# Patient Record
Sex: Female | Born: 1995 | Hispanic: Yes | Smoking: Never smoker
Health system: Southern US, Community
[De-identification: ages and names within clinical notes are randomized; demographics above are authoritative.]

## PROBLEM LIST (undated history)

## (undated) DIAGNOSIS — G43909 Migraine, unspecified, not intractable, without status migrainosus: Secondary | ICD-10-CM

## (undated) DIAGNOSIS — K529 Noninfective gastroenteritis and colitis, unspecified: Secondary | ICD-10-CM

## (undated) HISTORY — DX: Noninfective gastroenteritis and colitis, unspecified: K52.9

## (undated) HISTORY — DX: Migraine, unspecified, not intractable, without status migrainosus: G43.909

---
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
## 2016-12-28 IMAGING — US US OB LIMITED
1 series · 14 of 28 positions shown · non-contrast
Comparison: none

CLINICAL DATA: Pelvic pain for 3 days, pregnant, uncertain LMP and
gestational age

EXAM:
LIMITED OBSTETRIC ULTRASOUND

[Series 1: us ob limited · 0.22mm/px · 29 acquisitions, 14 frames shown]
[im 2/29]
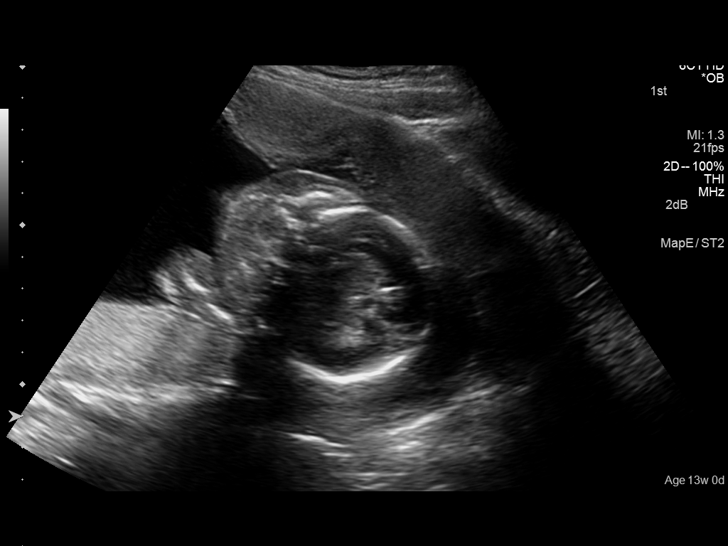
[im 4/29]
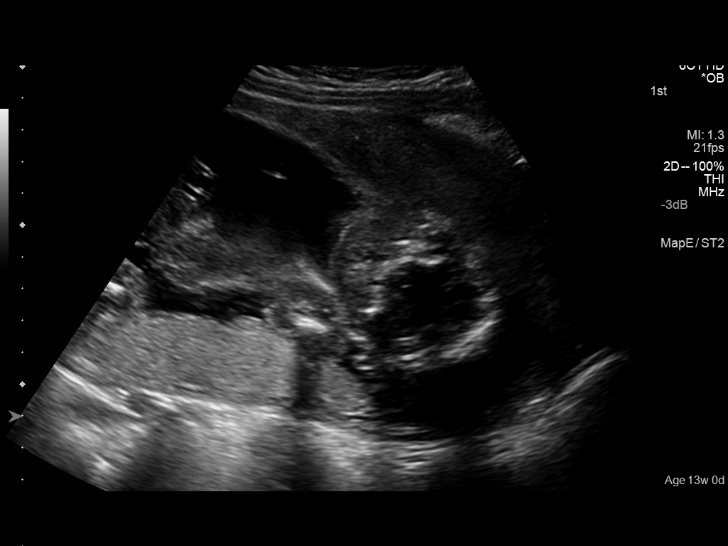
[im 6/29]
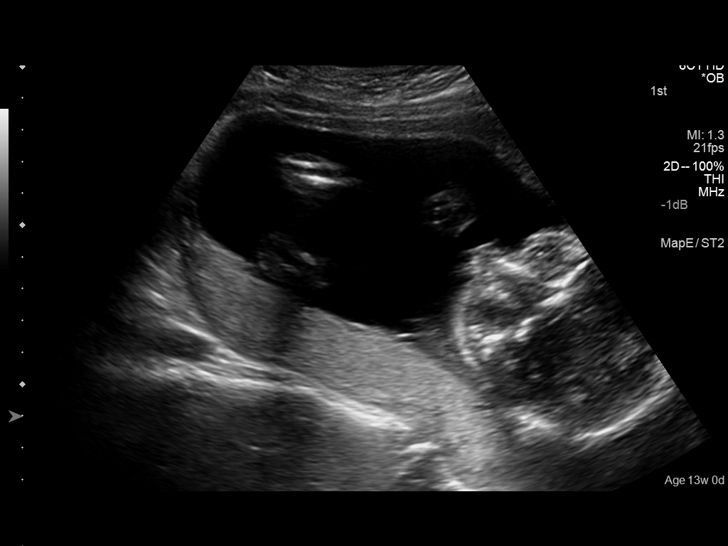
[im 8/29]
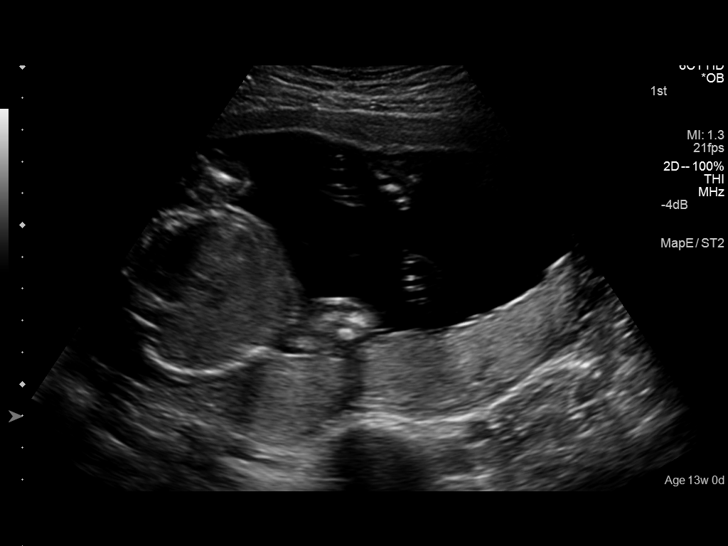
[im 10/29]
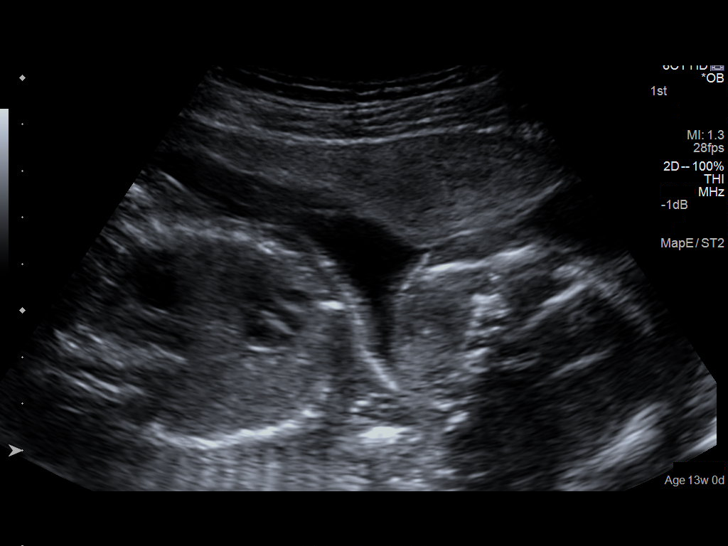
[im 12/29]
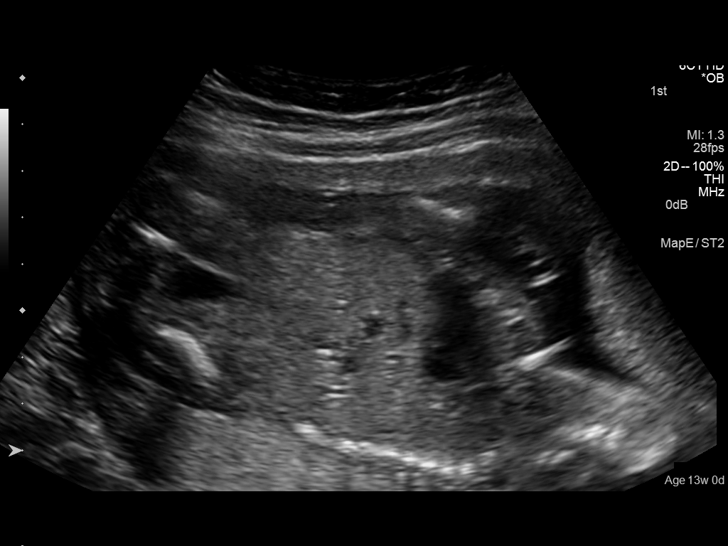
[im 14/29]
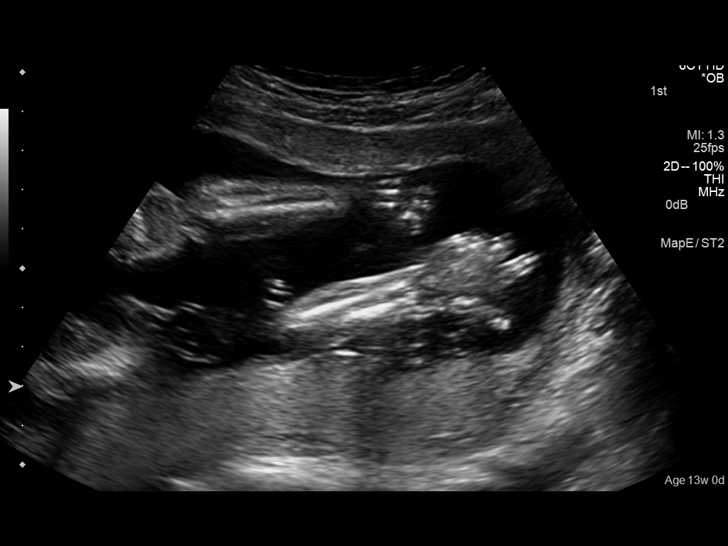
[im 16/29]
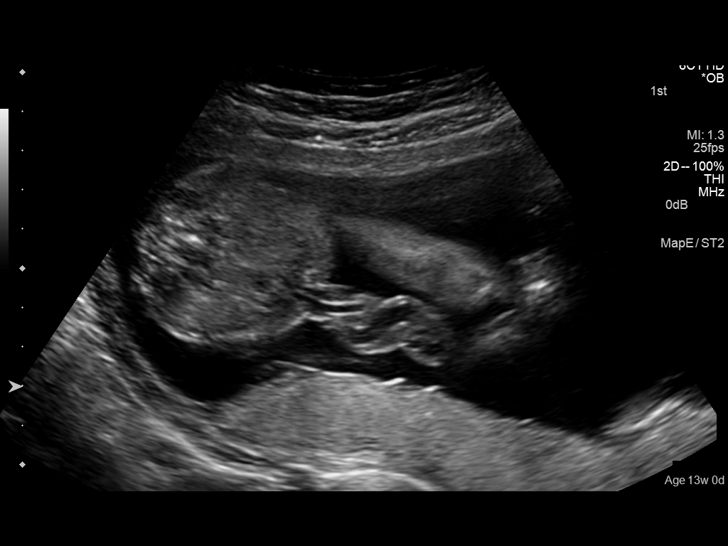
[im 18/29]
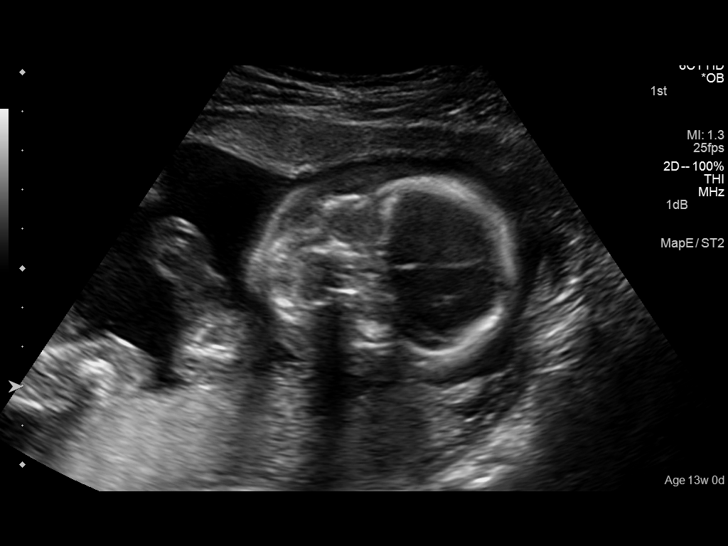
[im 20/29]
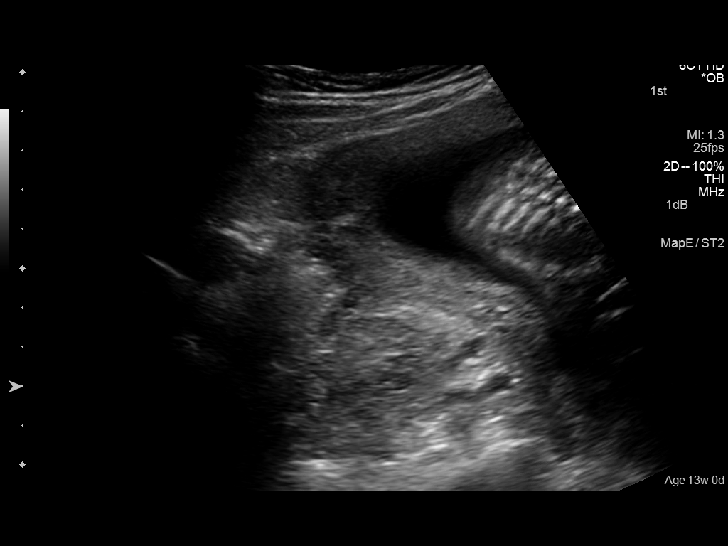
[im 22/29]
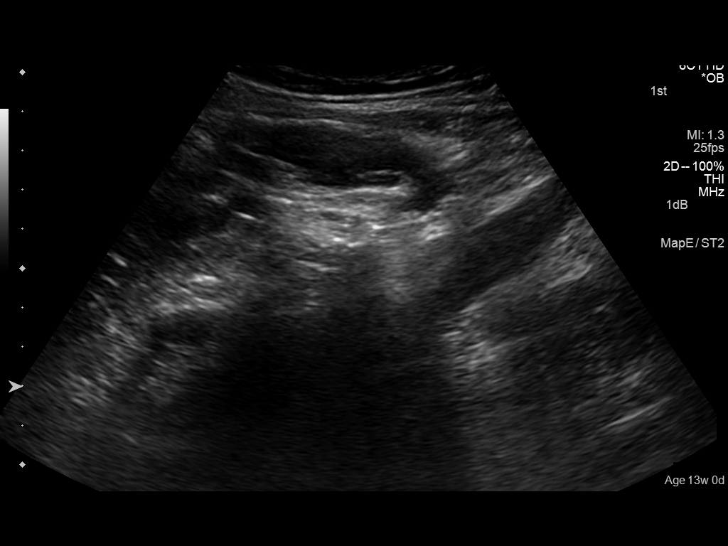
[im 24/29]
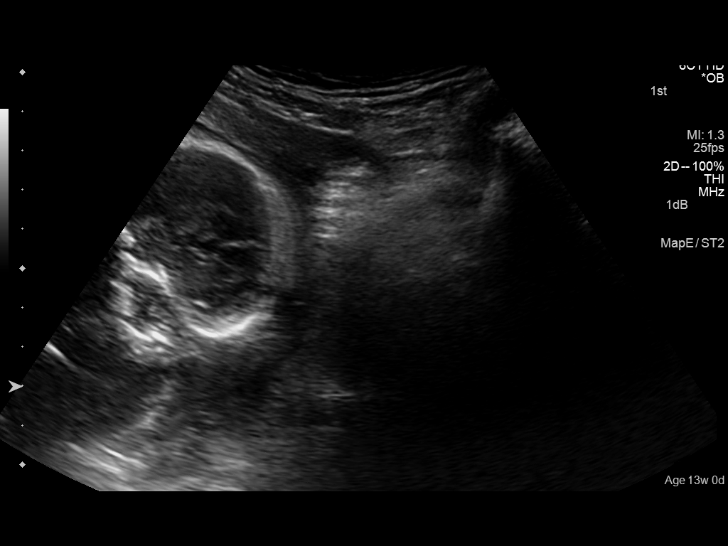
[im 26/29]
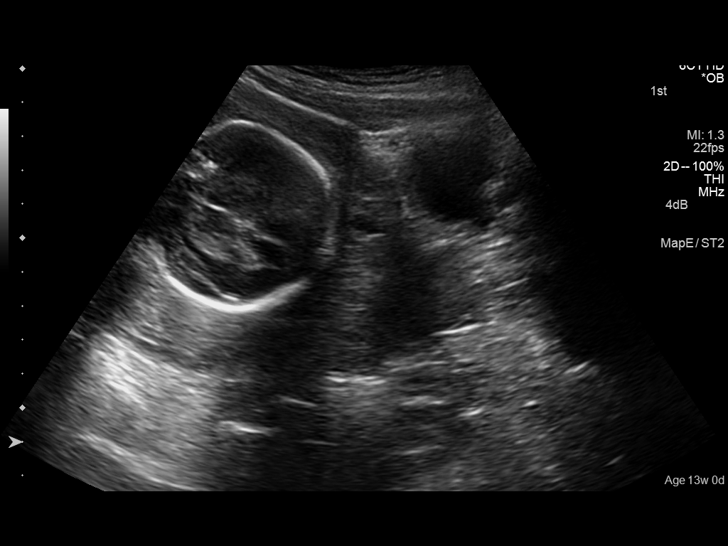
[im 29/29]
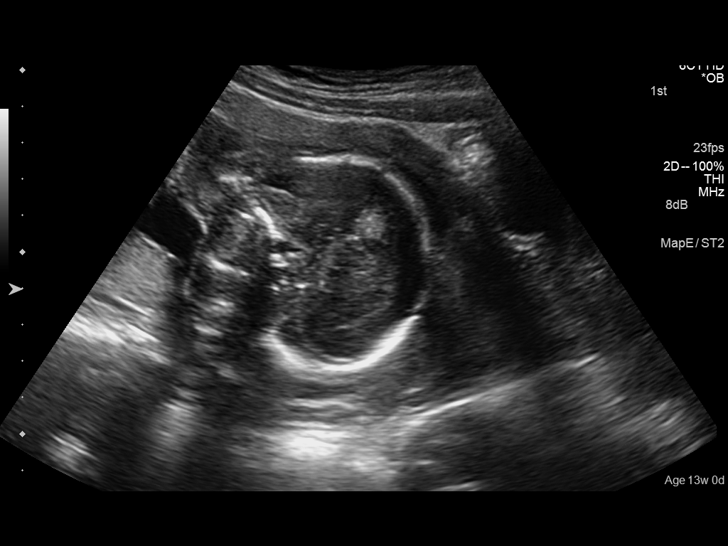

[14 of 28 positions shown; findings below may reference images not displayed]

FINDINGS: Number of Fetuses: 1

Heart Rate:  149 bpm

Movement: Yes

Presentation: Cephalic

Placental Location: Posterior

Previa: No

Amniotic Fluid (Subjective):  Within normal limits.

BPD:  5.3 cm 22 w 1 d

MATERNAL FINDINGS:

Cervix:  Appears closed.

Uterus/Adnexae:  No abnormality visualized.
IMPRESSION: Single live intrauterine gestation as above.

No acute abnormalities identified on limited OB ultrasound
assessment.

This exam is performed on an emergent basis and does not
comprehensively evaluate fetal size, dating, or anatomy; follow-up
complete OB US should be considered if further fetal assessment is
warranted.

## 2019-09-20 ENCOUNTER — Other Ambulatory Visit: Payer: Self-pay

## 2019-09-20 ENCOUNTER — Inpatient Hospital Stay (HOSPITAL_COMMUNITY)
Admission: AD | Admit: 2019-09-20 | Discharge: 2019-09-20 | Disposition: A | Discharge status: Home / Self Care | Payer: Self-pay | Attending: Obstetrics & Gynecology | Admitting: Obstetrics & Gynecology

## 2019-09-20 ENCOUNTER — Other Ambulatory Visit: Payer: Self-pay | Admitting: Obstetrics & Gynecology

## 2019-09-20 ENCOUNTER — Encounter (HOSPITAL_COMMUNITY): Payer: Self-pay | Admitting: Obstetrics & Gynecology

## 2019-09-20 ENCOUNTER — Inpatient Hospital Stay (HOSPITAL_COMMUNITY): Payer: Self-pay

## 2019-09-20 DIAGNOSIS — Z79899 Other long term (current) drug therapy: Secondary | ICD-10-CM | POA: Insufficient documentation

## 2019-09-20 DIAGNOSIS — Z3A00 Weeks of gestation of pregnancy not specified: Secondary | ICD-10-CM | POA: Insufficient documentation

## 2019-09-20 DIAGNOSIS — O020 Blighted ovum and nonhydatidiform mole: Secondary | ICD-10-CM | POA: Insufficient documentation

## 2019-09-20 DIAGNOSIS — Z6790 Unspecified blood type, Rh positive: Secondary | ICD-10-CM

## 2019-09-20 DIAGNOSIS — O4690 Antepartum hemorrhage, unspecified, unspecified trimester: Secondary | ICD-10-CM

## 2019-09-20 DIAGNOSIS — Z20822 Contact with and (suspected) exposure to covid-19: Secondary | ICD-10-CM | POA: Insufficient documentation

## 2019-09-20 LAB — CBC
HCT: 36.8 % (ref 36.0–46.0)
Hemoglobin: 12.6 g/dL (ref 12.0–15.0)
MCH: 31.2 pg (ref 26.0–34.0)
MCHC: 34.2 g/dL (ref 30.0–36.0)
MCV: 91.1 fL (ref 80.0–100.0)
Platelets: 207 10*3/uL (ref 150–400)
RBC: 4.04 MIL/uL (ref 3.87–5.11)
RDW: 12.5 % (ref 11.5–15.5)
WBC: 6.1 10*3/uL (ref 4.0–10.5)
nRBC: 0 % (ref 0.0–0.2)

## 2019-09-20 LAB — URINALYSIS, ROUTINE W REFLEX MICROSCOPIC
Bilirubin Urine: NEGATIVE
Glucose, UA: NEGATIVE mg/dL
Ketones, ur: NEGATIVE mg/dL
Nitrite: NEGATIVE
Protein, ur: NEGATIVE mg/dL
Specific Gravity, Urine: 1.011 (ref 1.005–1.030)
pH: 6 (ref 5.0–8.0)

## 2019-09-20 LAB — COMPREHENSIVE METABOLIC PANEL
ALT: 38 U/L (ref 0–44)
AST: 35 U/L (ref 15–41)
Albumin: 4 g/dL (ref 3.5–5.0)
Alkaline Phosphatase: 46 U/L (ref 38–126)
Anion gap: 8 (ref 5–15)
BUN: 5 mg/dL — ABNORMAL LOW (ref 6–20)
CO2: 25 mmol/L (ref 22–32)
Calcium: 9.7 mg/dL (ref 8.9–10.3)
Chloride: 103 mmol/L (ref 98–111)
Creatinine, Ser: 0.54 mg/dL (ref 0.44–1.00)
GFR calc Af Amer: >60 mL/min (ref >60)
GFR calc non Af Amer: >60 mL/min (ref >60)
Glucose, Bld: 84 mg/dL (ref 70–99)
Potassium: 4.4 mmol/L (ref 3.5–5.1)
Sodium: 136 mmol/L (ref 135–145)
Total Bilirubin: 0.5 mg/dL (ref 0.3–1.2)
Total Protein: 6.8 g/dL (ref 6.5–8.1)

## 2019-09-20 LAB — WET PREP, GENITAL
Clue Cells Wet Prep HPF POC: NONE SEEN
Sperm: NONE SEEN
Trich, Wet Prep: NONE SEEN
Yeast Wet Prep HPF POC: NONE SEEN

## 2019-09-20 LAB — SARS CORONAVIRUS 2 BY RT PCR (HOSPITAL ORDER, PERFORMED IN ~~LOC~~ HOSPITAL LAB): SARS Coronavirus 2: NEGATIVE

## 2019-09-20 LAB — HCG, QUANTITATIVE, PREGNANCY: hCG, Beta Chain, Quant, S: 545631 m[IU]/mL — ABNORMAL HIGH (ref <5)

## 2019-09-20 LAB — POCT PREGNANCY, URINE: Preg Test, Ur: POSITIVE — AB

## 2019-09-20 NOTE — Discharge Instructions (Signed)
Molar Pregnancy  A molar pregnancy (hydatidiform mole) is a mass of tissue that grows in the uterus after an egg is fertilized incorrectly. The mass does not develop into a fetus, and is considered an abnormal pregnancy. Usually, the pregnancy ends on its own through miscarriage. In some cases, treatment may be required. What are the causes? This condition is caused by an egg that is fertilized incorrectly so that it has abnormal genetic material (chromosomes). This can result in one of two types of molar pregnancies:  Complete molar pregnancy. This is when all of the chromosomes in the fertilized egg are from the father, and none are from the mother.  Partial molar pregnancy. This is when the fertilized egg has chromosomes from the father and mother, but it has too many chromosomes. What increases the risk? This condition is more likely to develop in:  Women who are over the age of 57 or under the age of 42.  Women who have had a molar pregnancy in the past (very rare). Other possible risk factors include:  Smoking more than 15 cigarettes a day.  History of infertility.  Having a blood type A, B, or AB.  Having a lack (deficiency) of vitamin A.  Using birth control pills (oral contraceptives). What are the signs or symptoms? Symptoms of this condition include:  Vaginal bleeding.  Missed menstrual period.  The uterus growing faster than expected for a normal pregnancy.  Severe nausea and vomiting.  Severe pressure or pain in the uterus.  Abnormal ovarian cysts (theca lutein cysts).  Vaginal discharge that looks like grapes.  High blood pressure (early onset of preeclampsia).  Overactive thyroid gland (hyperthyroidism).  Not having enough red blood cells or hemoglobin (anemia). How is this diagnosed? This condition is diagnosed based on ultrasound and blood tests. How is this treated? Usually, molar pregnancies end on their own by miscarriage. A health care provider  may manage this condition by:  Monitoring the levels of pregnancy hormones in your blood to make sure that the hormone levels are decreasing as expected.  Giving you a medicine called Rho (D) immune globulin. This medicine helps to prevent problems that may occur in future pregnancies as a result of a protein on red blood cells (Rh factor). You may be given this medicine if you do not have an Rh factor (you are Rh negative) and your sex partner has an Rh factor (he is Rh positive).  Putting you on chemotherapy. This involves taking medicines that regulate levels of pregnancy hormones. This may be done if your pregnancy hormone levels are not decreasing as expected.  Performing a procedure called dilation and curettage (D&C), or vacuum curettage. These are minor procedures that involve scraping or suctioning the molar pregnancy out of the uterus and removing it through the vagina. Even if a molar pregnancy ends on its own, one of these procedures may be done to make sure that all the abnormal tissue is out of the uterus.  Doing a surgical removal of the uterus (hysterectomy). Follow these instructions at home:  Avoid getting pregnant for 6-12 months, or as long as told by your health care provider. To avoid getting pregnant, avoid having sex or use a reliable form of birth control every time you have sex.  Take over-the-counter and prescription medicines only as told by your health care provider.  Rest as needed, and slowly return to your normal activities.  Think about joining a support group. If you are struggling with grief, ask  your health care provider for help.  Keep all follow-up visits as told by your health care provider. This is important. You may need follow-up blood tests or ultrasounds. Contact a health care provider if:  You continue to have irregular vaginal bleeding.  You have abdominal pain. Summary  A molar pregnancy (hydatidiform mole) is a mass of tissue that grows in  the uterus after an egg is fertilized incorrectly.  This condition is more likely to develop in women who are over the age of 76 or under the age of 45 or women who have had a molar pregnancy in the past.  The most common symptom of this condition is vaginal bleeding.  Usually, molar pregnancy ends with a miscarriage, and no treatment is needed. This information is not intended to replace advice given to you by your health care provider. Make sure you discuss any questions you have with your health care provider. Document Revised: 03/06/2017 Document Reviewed: 05/28/2016 Elsevier Patient Education  2020 [employer].

## 2019-09-20 NOTE — MAU Provider Note (Signed)
History     CSN: [id number]  Arrival date and time: 09/20/19 1131   None     Chief Complaint  Patient presents with  . Abdominal Pain  . Vaginal Bleeding   Ms. Yolanda Harmon is a 24 y.o. G2P1001 at Unknown who presents to MAU for vaginal bleeding which began about a month ago. Patient reports Nexplanon removal in March 2021. Patient reports heavy bleeding that she describes as liquid blood running down her leg and blood clots. Patient reports the bleeding got worse over the past month, but today reports only minimal bleeding this morning while wiping and denies bleeding here in MAU.  Patient reports she is also currently being treated for a UTI.  Passing blood clots? Per above Blood soaking clothes? no Lightheaded/dizzy? no Significant pelvic pain or cramping? Earlier today, but not at this time, menstrual-like cramping Passed any tissue? no  Current pregnancy problems? Pt has not yet been seen Blood Type? B Positive Allergies? NKDA Current medications? Pt unsure of antibiotic, but reports provider who prescribed is aware of pregnancy Current PNC & next appt? Pt requests list of OB providers  Pt denies vaginal discharge/odor/itching. Pt denies N/V, abdominal pain, constipation, diarrhea, or urinary problems. Pt denies fever, chills, fatigue, sweating or changes in appetite. Pt denies SOB or chest pain. Pt denies dizziness, HA, light-headedness, weakness.   OB History    Gravida  2   Para  1   Term  1   Preterm      AB      Living  1     SAB      TAB      Ectopic      Multiple  0   Live Births  1           History reviewed. No pertinent past medical history.  Past Surgical History:  Procedure Laterality Date  . ETONOGESTREL IMPLANT SYSTEM  12/28/2014        History reviewed. No pertinent family history.  Social History   Tobacco Use  . Smoking status: Never Smoker  . Smokeless tobacco: Never Used  Vaping Use  . Vaping Use: Never  used  Substance Use Topics  . Alcohol use: No  . Drug use: No    Allergies: No Known Allergies  Medications Prior to Admission  Medication Sig Dispense Refill Last Dose  . ondansetron (ZOFRAN ODT) 4 MG disintegrating tablet 4mg  ODT q4 hours prn nausea/vomit 10 tablet 0     Review of Systems  Constitutional: Negative for chills, diaphoresis, fatigue and fever.  Eyes: Negative for visual disturbance.  Respiratory: Negative for shortness of breath.   Cardiovascular: Negative for chest pain.  Gastrointestinal: Negative for abdominal pain, constipation, diarrhea, nausea and vomiting.  Genitourinary: Positive for pelvic pain (menstrual-like cramping, not present at this time) and vaginal bleeding (not present at this time). Negative for dysuria, flank pain, frequency, urgency and vaginal discharge.  Neurological: Negative for dizziness, weakness, light-headedness and headaches.   Physical Exam   Blood pressure 114/66, pulse 69, temperature 98.2 F (36.8 C), temperature source Oral, resp. rate 20, height 5\' 4"  (1.626 m), weight 58.8 kg, SpO2 100 %, unknown if currently breastfeeding.  Patient Vitals for the past 24 hrs:  BP Temp Temp src Pulse Resp SpO2 Height Weight  09/20/19 1209 114/66 -- -- 69 -- -- -- --  09/20/19 1144 106/61 98.2 F (36.8 C) Oral 74 20 100 % 5\' 4"  (1.626 m) 58.8 kg   Physical Exam  Constitutional: She is oriented to person, place, and time. She appears well-developed. No distress.  HENT:  Head: Normocephalic and atraumatic.  Respiratory: Effort normal.  GI: Soft. She exhibits no distension and no mass. There is no abdominal tenderness. There is no rebound and no guarding.  Fundus palpates half way between umbilicus and pubic symphysis  Neurological: She is alert and oriented to person, place, and time.  Skin: Skin is warm and dry. She is not diaphoretic.  Psychiatric: Her behavior is normal. Judgment and thought content normal.   Results for orders placed  or performed during the hospital encounter of 09/20/19 (from the past 24 hour(s))  Pregnancy, urine POC     Status: Abnormal   Collection Time: 09/20/19 11:56 AM  Result Value Ref Range   Preg Test, Ur POSITIVE (A) NEGATIVE  Urinalysis, Routine w reflex microscopic     Status: Abnormal   Collection Time: 09/20/19 12:02 PM  Result Value Ref Range   Color, Urine YELLOW YELLOW   APPearance CLOUDY (A) CLEAR   Specific Gravity, Urine 1.011 1.005 - 1.030   pH 6.0 5.0 - 8.0   Glucose, UA NEGATIVE NEGATIVE mg/dL   Hgb urine dipstick LARGE (A) NEGATIVE   Bilirubin Urine NEGATIVE NEGATIVE   Ketones, ur NEGATIVE NEGATIVE mg/dL   Protein, ur NEGATIVE NEGATIVE mg/dL   Nitrite NEGATIVE NEGATIVE   Leukocytes,Ua TRACE (A) NEGATIVE   RBC / HPF 0-5 0 - 5 RBC/hpf   WBC, UA 11-20 0 - 5 WBC/hpf   Bacteria, UA FEW (A) NONE SEEN   Squamous Epithelial / LPF 11-20 0 - 5   Mucus PRESENT   Wet prep, genital     Status: Abnormal   Collection Time: 09/20/19 12:44 PM  Result Value Ref Range   Yeast Wet Prep HPF POC NONE SEEN NONE SEEN   Trich, Wet Prep NONE SEEN NONE SEEN   Clue Cells Wet Prep HPF POC NONE SEEN NONE SEEN   WBC, Wet Prep HPF POC MANY (A) NONE SEEN   Sperm NONE SEEN   CBC     Status: None   Collection Time: 09/20/19 12:46 PM  Result Value Ref Range   WBC 6.1 4.0 - 10.5 K/uL   RBC 4.04 3.87 - 5.11 MIL/uL   Hemoglobin 12.6 12.0 - 15.0 g/dL   HCT 36.8 36 - 46 %   MCV 91.1 80.0 - 100.0 fL   MCH 31.2 26.0 - 34.0 pg   MCHC 34.2 30.0 - 36.0 g/dL   RDW 12.5 11.5 - 15.5 %   Platelets 207 150 - 400 K/uL   nRBC 0.0 0.0 - 0.2 %  Comprehensive metabolic panel     Status: Abnormal   Collection Time: 09/20/19 12:46 PM  Result Value Ref Range   Sodium 136 135 - 145 mmol/L   Potassium 4.4 3.5 - 5.1 mmol/L   Chloride 103 98 - 111 mmol/L   CO2 25 22 - 32 mmol/L   Glucose, Bld 84 70 - 99 mg/dL   BUN 5 (L) 6 - 20 mg/dL   Creatinine, Ser 0.54 0.44 - 1.00 mg/dL   Calcium 9.7 8.9 - 10.3 mg/dL    Total Protein 6.8 6.5 - 8.1 g/dL   Albumin 4.0 3.5 - 5.0 g/dL   AST 35 15 - 41 U/L   ALT 38 0 - 44 U/L   Alkaline Phosphatase 46 38 - 126 U/L   Total Bilirubin 0.5 0.3 - 1.2 mg/dL   GFR calc non Af Amer >  60 >60 mL/min   GFR calc Af Amer >60 >60 mL/min   Anion gap 8 5 - 15  hCG, quantitative, pregnancy     Status: Abnormal   Collection Time: 09/20/19 12:46 PM  Result Value Ref Range   hCG, Beta Chain, Quant, S 545,631 (H) <5 mIU/mL   US OB LESS THAN 14 WEEKS WITH OB TRANSVAGINAL  Result Date: 09/20/2019 CLINICAL DATA:  Heavy vaginal bleeding and cramping with positive pregnancy test EXAM: OBSTETRIC <14 WK [place] AND TRANSVAGINAL OB US TECHNIQUE: Both transabdominal and transvaginal ultrasound examinations were performed for complete evaluation of the gestation as well as the maternal uterus, adnexal regions, and pelvic cul-de-sac. Transvaginal technique was performed to assess early pregnancy. COMPARISON:  None. FINDINGS: Large intrauterine gestational sac is identified. Thickened placenta seen anteriorly on the left with heterogeneous cystic and solid components within the gestational sac. No identifiable fetal parts. 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 The junction between the myometrium and endometrium is well preserved anteriorly but given the bulkiness of the trophoblastic disease, posterior myometrium is not well visualized on either transabdominal or endovaginal scanning. Maternal uterus/adnexae: Corpus luteum cyst identified right ovary. Left ovary unremarkable. Possible trace free fluid in the cul-de-sac (image 38). IMPRESSION: Heterogeneous cystic and solid bulky lesion in the endometrial cavity consistent with gestational trophoblastic disease. Findings were discussed by telephone with Donia Ast, NP, at the time of study interpretation. Electronically Signed   By: Kennith Center M.D.   On: 09/20/2019 14:01    MAU Course   Procedures  MDM -r/o ectopic -UA: cloudy/lg hgb/trace leuks/few bacteria, patient already sent ABX for UTI from other provider -CBC: WNL -CMP: no abnormalities requiring treatment -[place]: Large intrauterine gestational sac is identified. Thickened placenta seen anteriorly on the left with heterogeneous cystic and solid components within the gestational sac. No identifiable fetal parts. 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 The junction between the myometrium and endometrium is well preserved anteriorly but given the bulkiness of the trophoblastic disease, posterior myometrium is not well visualized on either transabdominal or endovaginal scanning. Maternal uterus/adnexae: Corpus luteum cyst identified right ovary. Left ovary unremarkable. Possible trace free fluid in the cul-de-sac (image 38). -hCG: 299,371IRCV -ABO: B Positive -WetPrep: WNL -GC/CT collected -consulted with Dr. Debroah Loop who consulted with MFM, went to bedside to discuss with patient, and will schedule for outpatient D&E in main OR. Dr. Debroah Loop messaged scheduling from MAU to schedule. -pt discharged to home in stable condition  Orders Placed This Encounter  Procedures  . Wet prep, genital    Standing Status:   Standing    Number of Occurrences:   1  . SARS Coronavirus 2 by RT PCR (hospital order, performed in [facility] hospital lab) Nasopharyngeal Nasopharyngeal Swab    Standing Status:   Standing    Number of Occurrences:   1    Order Specific Question:   Is this test for diagnosis or screening    Answer:   Screening    Order Specific Question:   Symptomatic for COVID-19 as defined by CDC    Answer:   No    Order Specific Question:   Hospitalized for COVID-19    Answer:   No    Order Specific Question:   Admitted to ICU for COVID-19    Answer:   No    Order Specific Question:   Previously tested for COVID-19    Answer:   No    Order Specific Question:  Resident in a  congregate (group) care setting    Answer:   No    Order Specific Question:   Employed in healthcare setting    Answer:   No    Order Specific Question:   Pregnant    Answer:   Yes    Order Specific Question:   Has patient completed COVID vaccination(s) (2 doses of Pfizer/Moderna 1 dose of [employer])    Answer:   Unknown  . US OB LESS THAN 14 WEEKS WITH OB TRANSVAGINAL    Standing Status:   Standing    Number of Occurrences:   1    Order Specific Question:   Symptom/Reason for Exam    Answer:   Vaginal bleeding in pregnancy [705036]  . Urinalysis, Routine w reflex microscopic    Standing Status:   Standing    Number of Occurrences:   1  . CBC    Standing Status:   Standing    Number of Occurrences:   1  . Comprehensive metabolic panel    Standing Status:   Standing    Number of Occurrences:   1  . hCG, quantitative, pregnancy    Standing Status:   Standing    Number of Occurrences:   1  . Diet NPO time specified    Standing Status:   Standing    Number of Occurrences:   1  . Pregnancy, urine POC    Standing Status:   Standing    Number of Occurrences:   1  . Discharge patient    Order Specific Question:   Discharge disposition    Answer:   01-Home or Self Care [1]    Order Specific Question:   Discharge patient date    Answer:   09/20/2019   No orders of the defined types were placed in this encounter.  Assessment and Plan   1. Molar pregnancy   2. Vaginal bleeding in pregnancy   3. Blood type, Rh positive     Allergies as of 09/20/2019   No Known Allergies     Medication List    TAKE these medications   ondansetron 4 MG disintegrating tablet Commonly known as: Zofran ODT 4mg  ODT q4 hours prn nausea/vomit       -will receive phone call to schedule for D&E -bleeding precautions given -return MAU precautions given -pt discharged to home in stable condition  E Dvonte Gatliff 09/20/2019, 4:27 PM

## 2019-09-20 NOTE — MAU Note (Signed)
Presents with c/o heavy VB and lower abdominal cramping x1 month.  +UPT 09/19/2019 @ MD office.  Unsure of LMP, reports irregular menstrual cycles.

## 2019-09-21 ENCOUNTER — Other Ambulatory Visit (HOSPITAL_COMMUNITY): Payer: Self-pay | Admitting: Obstetrics & Gynecology

## 2019-09-21 ENCOUNTER — Other Ambulatory Visit: Payer: Self-pay | Admitting: Obstetrics & Gynecology

## 2019-09-21 DIAGNOSIS — O020 Blighted ovum and nonhydatidiform mole: Secondary | ICD-10-CM

## 2019-09-21 LAB — GC/CHLAMYDIA PROBE AMP (~~LOC~~) NOT AT ARMC
Chlamydia: NEGATIVE
Comment: NEGATIVE
Comment: NORMAL
Neisseria Gonorrhea: NEGATIVE

## 2019-09-22 ENCOUNTER — Other Ambulatory Visit: Payer: Self-pay

## 2019-09-22 ENCOUNTER — Encounter (HOSPITAL_COMMUNITY): Payer: Self-pay | Admitting: Obstetrics & Gynecology

## 2019-09-22 MED ORDER — DOXYCYCLINE HYCLATE 100 MG IV SOLR
200.0000 mg | INTRAVENOUS | Status: AC
  Administered 2019-09-23: 200 mg via INTRAVENOUS
  Filled 2019-09-22: qty 200

## 2019-09-22 NOTE — Progress Notes (Signed)
Interpreter has been requested but can not arrive until 0845.  Patient is scheduled to arrive at 0830.

## 2019-09-22 NOTE — Anesthesia Preprocedure Evaluation (Addendum)
Anesthesia Evaluation  Patient identified by MRN, date of birth, ID band Patient awake    Reviewed: Allergy & Precautions, NPO status , Patient's Chart, lab work & pertinent test results  History of Anesthesia Complications Negative for: history of anesthetic complications  Airway Mallampati: II  TM Distance: >3 FB Neck ROM: Full    Dental no notable dental hx.    Pulmonary neg pulmonary ROS,    Pulmonary exam normal        Cardiovascular negative cardio ROS Normal cardiovascular exam     Neuro/Psych negative neurological ROS  negative psych ROS   GI/Hepatic negative GI ROS, Neg liver ROS,   Endo/Other  negative endocrine ROS  Renal/GU negative Renal ROS  negative genitourinary   Musculoskeletal negative musculoskeletal ROS (+)   Abdominal   Peds  Hematology negative hematology ROS (+)   Anesthesia Other Findings Day of surgery medications reviewed with patient.  Reproductive/Obstetrics (+) Pregnancy (molar pregnancy)                           Anesthesia Physical Anesthesia Plan  ASA: II  Anesthesia Plan: MAC   Post-op Pain Management:    Induction: Intravenous  PONV Risk Score and Plan: 2 and Propofol infusion, TIVA, Treatment may vary due to age or medical condition and Midazolam  Airway Management Planned: Natural Airway and Simple Face Mask  Additional Equipment: None  Intra-op Plan:   Post-operative Plan:   Informed Consent: I have reviewed the patients History and Physical, chart, labs and discussed the procedure including the risks, benefits and alternatives for the proposed anesthesia with the patient or authorized representative who has indicated his/her understanding and acceptance.       Plan Discussed with: CRNA  Anesthesia Plan Comments:       Anesthesia Quick Evaluation

## 2019-09-22 NOTE — Progress Notes (Addendum)
I had an interpreter leave a voice message for Yolanda Harmon. I received a call back from patient, she said that she does no need an interpreter, she considers English to be her primary language. I notified Rande Lawman to cancel the interpreter for tomorrow and we updated her language in EPIC.  Yolanda Harmon denies chest pain or shortness of breath. Patient was tested for Covid and has been in quarantine since that time. Patient tested negative for Covid_6/15/21_ and has been in quarantine since that time.  Yolanda Harmon reports that she has had diarrhea for along time and is followed by [facility]. Patient reported that she had bacteria from testing that was done at that time. Yolanda Harmon states that she is having approximately 3 stools a day and that they are watery. I called [facility] and spoke with an assistant, she reviewed patient's record and said that there infection had was bacteria in urine. Physician wants to run studies to see if patient has Chron's, but then patient became pregnant and the work up was put on hold. I spoke with Sherian Rein, AD, MSN who said it should be ok.

## 2019-09-23 ENCOUNTER — Ambulatory Visit (HOSPITAL_COMMUNITY): Payer: Self-pay | Admitting: Anesthesiology

## 2019-09-23 ENCOUNTER — Encounter (HOSPITAL_COMMUNITY): Payer: Self-pay | Admitting: Obstetrics & Gynecology

## 2019-09-23 ENCOUNTER — Ambulatory Visit (HOSPITAL_COMMUNITY)
Admission: RE | Admit: 2019-09-23 | Discharge: 2019-09-23 | Disposition: A | Discharge status: Home / Self Care | Payer: Self-pay | Source: Ambulatory Visit | Attending: Obstetrics & Gynecology | Admitting: Obstetrics & Gynecology

## 2019-09-23 ENCOUNTER — Ambulatory Visit (HOSPITAL_COMMUNITY)
Admission: RE | Admit: 2019-09-23 | Discharge: 2019-09-23 | Disposition: A | Discharge status: Home / Self Care | Payer: Self-pay | Attending: Obstetrics & Gynecology | Admitting: Obstetrics & Gynecology

## 2019-09-23 ENCOUNTER — Encounter (HOSPITAL_COMMUNITY)
Admission: RE | Disposition: A | Discharge status: Home / Self Care | Payer: Self-pay | Source: Home / Self Care | Attending: Obstetrics & Gynecology

## 2019-09-23 DIAGNOSIS — O020 Blighted ovum and nonhydatidiform mole: Secondary | ICD-10-CM

## 2019-09-23 DIAGNOSIS — N390 Urinary tract infection, site not specified: Secondary | ICD-10-CM | POA: Insufficient documentation

## 2019-09-23 HISTORY — PX: OPERATIVE ULTRASOUND: SHX5996

## 2019-09-23 HISTORY — PX: DILATION AND EVACUATION: SHX1459

## 2019-09-23 LAB — TYPE AND SCREEN
ABO/RH(D): B POS
Antibody Screen: NEGATIVE

## 2019-09-23 LAB — ABO/RH: ABO/RH(D): B POS

## 2019-09-23 SURGERY — DILATION AND EVACUATION, UTERUS
Anesthesia: Monitor Anesthesia Care | Site: Vagina

## 2019-09-23 MED ORDER — MIDAZOLAM HCL 5 MG/5ML IJ SOLN
INTRAMUSCULAR | Status: DC | PRN
  Administered 2019-09-23: 2 mg via INTRAVENOUS

## 2019-09-23 MED ORDER — PROPOFOL 500 MG/50ML IV EMUL
INTRAVENOUS | Status: DC | PRN
  Administered 2019-09-23: 100 ug/kg/min via INTRAVENOUS

## 2019-09-23 MED ORDER — MIDAZOLAM HCL 2 MG/2ML IJ SOLN
INTRAMUSCULAR | Status: AC
  Filled 2019-09-23: qty 2

## 2019-09-23 MED ORDER — FENTANYL CITRATE (PF) 100 MCG/2ML IJ SOLN
25.0000 ug | INTRAMUSCULAR | Status: DC | PRN
  Administered 2019-09-23: 50 ug via INTRAVENOUS

## 2019-09-23 MED ORDER — OXYCODONE HCL 5 MG PO TABS
ORAL_TABLET | ORAL | Status: AC
  Filled 2019-09-23: qty 1

## 2019-09-23 MED ORDER — ORAL CARE MOUTH RINSE: 15.0000 mL | Freq: Once | OROMUCOSAL | Status: AC

## 2019-09-23 MED ORDER — ACETAMINOPHEN 500 MG PO TABS: 1000.0000 mg | ORAL_TABLET | Freq: Once | ORAL | Status: DC

## 2019-09-23 MED ORDER — OXYCODONE-ACETAMINOPHEN 5-325 MG PO TABS: 1.0000 | ORAL_TABLET | Freq: Four times a day (QID) | ORAL | 0 refills | Status: DC | PRN

## 2019-09-23 MED ORDER — FENTANYL CITRATE (PF) 100 MCG/2ML IJ SOLN
INTRAMUSCULAR | Status: AC
  Filled 2019-09-23: qty 2

## 2019-09-23 MED ORDER — LACTATED RINGERS IV SOLN: INTRAVENOUS | Status: DC

## 2019-09-23 MED ORDER — PROMETHAZINE HCL 25 MG/ML IJ SOLN: 6.2500 mg | INTRAMUSCULAR | Status: DC | PRN

## 2019-09-23 MED ORDER — CHLORHEXIDINE GLUCONATE 0.12 % MT SOLN: 15.0000 mL | Freq: Once | OROMUCOSAL | Status: AC

## 2019-09-23 MED ORDER — CHLORHEXIDINE GLUCONATE 0.12 % MT SOLN
OROMUCOSAL | Status: AC
  Administered 2019-09-23: 15 mL via OROMUCOSAL
  Filled 2019-09-23: qty 15

## 2019-09-23 MED ORDER — BUPIVACAINE HCL (PF) 0.5 % IJ SOLN
INTRAMUSCULAR | Status: AC
  Filled 2019-09-23: qty 30

## 2019-09-23 MED ORDER — LIDOCAINE 2% (20 MG/ML) 5 ML SYRINGE
INTRAMUSCULAR | Status: DC | PRN
  Administered 2019-09-23: 60 mg via INTRAVENOUS

## 2019-09-23 MED ORDER — OXYCODONE HCL 5 MG/5ML PO SOLN: 5.0000 mg | Freq: Once | ORAL | Status: DC | PRN

## 2019-09-23 MED ORDER — BUPIVACAINE HCL (PF) 0.5 % IJ SOLN
INTRAMUSCULAR | Status: DC | PRN
  Administered 2019-09-23: 10 mL

## 2019-09-23 MED ORDER — OXYCODONE HCL 5 MG PO TABS: 5.0000 mg | ORAL_TABLET | Freq: Once | ORAL | Status: DC | PRN

## 2019-09-23 MED ORDER — IBUPROFEN 600 MG PO TABS
600.0000 mg | ORAL_TABLET | Freq: Four times a day (QID) | ORAL | 1 refills | Status: DC | PRN
Start: 2019-09-23 — End: 2020-07-28

## 2019-09-23 SURGICAL SUPPLY — 21 items
CATH ROBINSON RED A/P 16FR (CATHETERS) ×2 IMPLANT
DECANTER SPIKE VIAL GLASS SM (MISCELLANEOUS) IMPLANT
FILTER UTR ASPR ASSEMBLY (MISCELLANEOUS) ×2 IMPLANT
GLOVE BIO SURGEON STRL SZ 6.5 (GLOVE) ×2 IMPLANT
GLOVE BIOGEL PI IND STRL 7.0 (GLOVE) ×2 IMPLANT
GLOVE BIOGEL PI INDICATOR 7.0 (GLOVE) ×2
GOWN STRL REUS W/ TWL LRG LVL3 (GOWN DISPOSABLE) ×2 IMPLANT
GOWN STRL REUS W/TWL LRG LVL3 (GOWN DISPOSABLE) ×2
HOSE CONNECTING 18IN BERKELEY (TUBING) ×2 IMPLANT
KIT BERKELEY 1ST TRI 3/8 NO TR (MISCELLANEOUS) ×2 IMPLANT
KIT BERKELEY 1ST TRIMESTER 3/8 (MISCELLANEOUS) ×2 IMPLANT
NS IRRIG 1000ML POUR BTL (IV SOLUTION) ×2 IMPLANT
PACK VAGINAL MINOR WOMEN LF (CUSTOM PROCEDURE TRAY) ×2 IMPLANT
PAD OB MATERNITY 4.3X12.25 (PERSONAL CARE ITEMS) ×2 IMPLANT
SET BERKELEY SUCTION TUBING (SUCTIONS) IMPLANT
TOWEL GREEN STERILE FF (TOWEL DISPOSABLE) ×4 IMPLANT
UNDERPAD 30X36 HEAVY ABSORB (UNDERPADS AND DIAPERS) ×2 IMPLANT
VACURETTE 10 RIGID CVD (CANNULA) ×2 IMPLANT
VACURETTE 7MM CVD STRL WRAP (CANNULA) IMPLANT
VACURETTE 8 RIGID CVD (CANNULA) IMPLANT
VACURETTE 9 RIGID CVD (CANNULA) IMPLANT

## 2019-09-23 NOTE — H&P (Signed)
History   CSN: [id number]  Arrival date and time: 09/20/19 1131   None        Chief Complaint  Patient presents with  . Abdominal Pain  . Vaginal Bleeding   Ms. Yolanda Harmon is a 24 y.o. G2P1001 at Unknown who presents to MAU for vaginal bleeding which began about a month ago. Patient reports Nexplanon removal in March 2021. Patient reports heavy bleeding that she describes as liquid blood running down her leg and blood clots. Patient reports the bleeding got worse over the past month, but today reports only minimal bleeding this morning while wiping and denies bleeding here in MAU.  Patient reports she is also currently being treated for a UTI.  Passing blood clots? Per above Blood soaking clothes? no Lightheaded/dizzy? no Significant pelvic pain or cramping? Earlier today, but not at this time, menstrual-like cramping Passed any tissue? no  Current pregnancy problems? Pt has not yet been seen Blood Type? B Positive Allergies? NKDA Current medications? Pt unsure of antibiotic, but reports provider who prescribed is aware of pregnancy Current PNC & next appt? Pt requests list of OB providers  Pt denies vaginal discharge/odor/itching. Pt denies N/V, abdominal pain, constipation, diarrhea, or urinary problems. Pt denies fever, chills, fatigue, sweating or changes in appetite. Pt denies SOB or chest pain. Pt denies dizziness, HA, light-headedness, weakness.           OB History    Gravida  2   Para  1   Term  1   Preterm      AB      Living  1     SAB      TAB      Ectopic      Multiple  0   Live Births  1           History reviewed. No pertinent past medical history.       Past Surgical History:  Procedure Laterality Date  . ETONOGESTREL IMPLANT SYSTEM  12/28/2014        History reviewed. No pertinent family history.  Social History       Tobacco Use  . Smoking status: Never Smoker  . Smokeless tobacco:  Never Used  Vaping Use  . Vaping Use: Never used  Substance Use Topics  . Alcohol use: No  . Drug use: No    Allergies: No Known Allergies         Medications Prior to Admission  Medication Sig Dispense Refill Last Dose  . ondansetron (ZOFRAN ODT) 4 MG disintegrating tablet 4mg  ODT q4 hours prn nausea/vomit 10 tablet 0     Review of Systems  Constitutional: Negative for chills, diaphoresis, fatigue and fever.  Eyes: Negative for visual disturbance.  Respiratory: Negative for shortness of breath.   Cardiovascular: Negative for chest pain.  Gastrointestinal: Negative for abdominal pain, constipation, diarrhea, nausea and vomiting.  Genitourinary: Positive for pelvic pain (menstrual-like cramping, not present at this time) and vaginal bleeding (not present at this time). Negative for dysuria, flank pain, frequency, urgency and vaginal discharge.  Neurological: Negative for dizziness, weakness, light-headedness and headaches.   Physical Exam   Blood pressure 114/66, pulse 69, temperature 98.2 F (36.8 C), temperature source Oral, resp. rate 20, height 5\' 4"  (1.626 m), weight 58.8 kg, SpO2 100 %, unknown if currently breastfeeding.  Patient Vitals for the past 24 hrs:  BP Temp Temp src Pulse Resp SpO2 Height Weight  09/20/19 1209 114/66 -- -- 69 -- -- -- --  09/20/19 1144 106/61 98.2 F (36.8 C) Oral 74 20 100 % 5\' 4"  (1.626 m) 58.8 kg   Physical Exam  Constitutional: She is oriented to person, place, and time. She appears well-developed. No distress.  HENT:  Head: Normocephalic and atraumatic.  Respiratory: Effort normal.  GI: Soft. She exhibits no distension and no mass. There is no abdominal tenderness. There is no rebound and no guarding.  Fundus palpates half way between umbilicus and pubic symphysis  Neurological: She is alert and oriented to person, place, and time.  Skin: Skin is warm and dry. She is not diaphoretic.  Psychiatric: Her behavior is normal.  Judgment and thought content normal.   Lab Results Last 24 Hours       Results for orders placed or performed during the hospital encounter of 09/20/19 (from the past 24 hour(s))  Pregnancy, urine POC     Status: Abnormal   Collection Time: 09/20/19 11:56 AM  Result Value Ref Range   Preg Test, Ur POSITIVE (A) NEGATIVE  Urinalysis, Routine w reflex microscopic     Status: Abnormal   Collection Time: 09/20/19 12:02 PM  Result Value Ref Range   Color, Urine YELLOW YELLOW   APPearance CLOUDY (A) CLEAR   Specific Gravity, Urine 1.011 1.005 - 1.030   pH 6.0 5.0 - 8.0   Glucose, UA NEGATIVE NEGATIVE mg/dL   Hgb urine dipstick LARGE (A) NEGATIVE   Bilirubin Urine NEGATIVE NEGATIVE   Ketones, ur NEGATIVE NEGATIVE mg/dL   Protein, ur NEGATIVE NEGATIVE mg/dL   Nitrite NEGATIVE NEGATIVE   Leukocytes,Ua TRACE (A) NEGATIVE   RBC / HPF 0-5 0 - 5 RBC/hpf   WBC, UA 11-20 0 - 5 WBC/hpf   Bacteria, UA FEW (A) NONE SEEN   Squamous Epithelial / LPF 11-20 0 - 5   Mucus PRESENT   Wet prep, genital     Status: Abnormal   Collection Time: 09/20/19 12:44 PM  Result Value Ref Range   Yeast Wet Prep HPF POC NONE SEEN NONE SEEN   Trich, Wet Prep NONE SEEN NONE SEEN   Clue Cells Wet Prep HPF POC NONE SEEN NONE SEEN   WBC, Wet Prep HPF POC MANY (A) NONE SEEN   Sperm NONE SEEN   CBC     Status: None   Collection Time: 09/20/19 12:46 PM  Result Value Ref Range   WBC 6.1 4.0 - 10.5 K/uL   RBC 4.04 3.87 - 5.11 MIL/uL   Hemoglobin 12.6 12.0 - 15.0 g/dL   HCT 09/22/19 36 - 46 %   MCV 91.1 80.0 - 100.0 fL   MCH 31.2 26.0 - 34.0 pg   MCHC 34.2 30.0 - 36.0 g/dL   RDW 06.2 37.6 - 28.3 %   Platelets 207 150 - 400 K/uL   nRBC 0.0 0.0 - 0.2 %  Comprehensive metabolic panel     Status: Abnormal   Collection Time: 09/20/19 12:46 PM  Result Value Ref Range   Sodium 136 135 - 145 mmol/L   Potassium 4.4 3.5 - 5.1 mmol/L   Chloride 103 98 - 111 mmol/L   CO2 25 22 - 32  mmol/L   Glucose, Bld 84 70 - 99 mg/dL   BUN 5 (L) 6 - 20 mg/dL   Creatinine, Ser 09/22/19 0.44 - 1.00 mg/dL   Calcium 9.7 8.9 - 7.61 mg/dL   Total Protein 6.8 6.5 - 8.1 g/dL   Albumin 4.0 3.5 - 5.0 g/dL   AST 35 15 - 41  U/L   ALT 38 0 - 44 U/L   Alkaline Phosphatase 46 38 - 126 U/L   Total Bilirubin 0.5 0.3 - 1.2 mg/dL   GFR calc non Af Amer >60 >60 mL/min   GFR calc Af Amer >60 >60 mL/min   Anion gap 8 5 - 15  hCG, quantitative, pregnancy     Status: Abnormal   Collection Time: 09/20/19 12:46 PM  Result Value Ref Range   hCG, Beta Chain, Quant, S 545,631 (H) <5 mIU/mL      Imaging Results  US OB LESS THAN 14 WEEKS WITH OB TRANSVAGINAL  Result Date: 09/20/2019 CLINICAL DATA:  Heavy vaginal bleeding and cramping with positive pregnancy test EXAM: OBSTETRIC <14 WK [place] AND TRANSVAGINAL OB US TECHNIQUE: Both transabdominal and transvaginal ultrasound examinations were performed for complete evaluation of the gestation as well as the maternal uterus, adnexal regions, and pelvic cul-de-sac. Transvaginal technique was performed to assess early pregnancy. COMPARISON:  None. FINDINGS: Large intrauterine gestational sac is identified. Thickened placenta seen anteriorly on the left with heterogeneous cystic and solid components within the gestational sac. No identifiable fetal parts. 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 The junction between the myometrium and endometrium is well preserved anteriorly but given the bulkiness of the trophoblastic disease, posterior myometrium is not well visualized on either transabdominal or endovaginal scanning. Maternal uterus/adnexae: Corpus luteum cyst identified right ovary. Left ovary unremarkable. Possible trace free fluid in the cul-de-sac (image 38). IMPRESSION: Heterogeneous cystic and solid bulky lesion in the endometrial cavity consistent with gestational trophoblastic disease. Findings were discussed  by telephone with Vernice Jefferson, NP, at the time of study interpretation. Electronically Signed   By: Misty Stanley M.D.   On: 09/20/2019 14:01     MAU Course  Procedures  MDM -r/o ectopic -UA: cloudy/lg hgb/trace leuks/few bacteria, patient already sent ABX for UTI from other provider -CBC: WNL -CMP: no abnormalities requiring treatment -[place]: Large intrauterine gestational sac is identified. Thickened placenta seen anteriorly on the left with heterogeneous cystic and solid components within the gestational sac. No identifiable fetal parts. 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 The junction between the myometrium and endometrium is well preserved anteriorly but given the bulkiness of the trophoblastic disease, posterior myometrium is not well visualized on either transabdominal or endovaginal scanning. Maternal uterus/adnexae: Corpus luteum cyst identified right ovary. Left ovary unremarkable. Possible trace free fluid in the cul-de-sac (image 38). -hCG: [id number] -ABO: B Positive -WetPrep: WNL -GC/CT collected -consulted with Dr. Roselie Awkward who consulted with MFM, went to bedside to discuss with patient, and will schedule for outpatient D&E in main OR. Dr. Roselie Awkward messaged scheduling from MAU to schedule. -pt discharged to home in stable condition       Orders Placed This Encounter  Procedures  . Wet prep, genital    Standing Status:   Standing    Number of Occurrences:   1  . SARS Coronavirus 2 by RT PCR (hospital order, performed in [facility] hospital lab) Nasopharyngeal Nasopharyngeal Swab    Standing Status:   Standing    Number of Occurrences:   1    Order Specific Question:   Is this test for diagnosis or screening    Answer:   Screening    Order Specific Question:   Symptomatic for COVID-19 as defined by CDC    Answer:   No    Order Specific Question:   Hospitalized for COVID-19    Answer:  No    Order  Specific Question:   Admitted to ICU for COVID-19    Answer:   No    Order Specific Question:   Previously tested for COVID-19    Answer:   No    Order Specific Question:   Resident in a congregate (group) care setting    Answer:   No    Order Specific Question:   Employed in healthcare setting    Answer:   No    Order Specific Question:   Pregnant    Answer:   Yes    Order Specific Question:   Has patient completed COVID vaccination(s) (2 doses of Pfizer/Moderna 1 dose of [employer])    Answer:   Unknown  . US OB LESS THAN 14 WEEKS WITH OB TRANSVAGINAL    Standing Status:   Standing    Number of Occurrences:   1    Order Specific Question:   Symptom/Reason for Exam    Answer:   Vaginal bleeding in pregnancy [705036]  . Urinalysis, Routine w reflex microscopic    Standing Status:   Standing    Number of Occurrences:   1  . CBC    Standing Status:   Standing    Number of Occurrences:   1  . Comprehensive metabolic panel    Standing Status:   Standing    Number of Occurrences:   1  . hCG, quantitative, pregnancy    Standing Status:   Standing    Number of Occurrences:   1  . Diet NPO time specified    Standing Status:   Standing    Number of Occurrences:   1  . Pregnancy, urine POC    Standing Status:   Standing    Number of Occurrences:   1  . Discharge patient    Order Specific Question:   Discharge disposition    Answer:   01-Home or Self Care [1]    Order Specific Question:   Discharge patient date    Answer:   09/20/2019   No orders of the defined types were placed in this encounter.  Assessment and Plan   1. Molar pregnancy   2. Vaginal bleeding in pregnancy   3. Blood type, Rh positive     Allergies as of 09/20/2019   No Known Allergies        Medication List    TAKE these medications   ondansetron 4 MG disintegrating tablet Commonly known as: Zofran ODT 4mg  ODT q4 hours prn  nausea/vomit       -will receive phone call to schedule for D&E -bleeding precautions given -return MAU precautions given -pt discharged to home in stable condition  [place] 09/20/2019, 4:27 PM   Patient desires surgical management with D&E.  The risks of surgery were discussed in detail with the patient including but not limited to: bleeding which may require transfusion or reoperation; infection which may require prolonged hospitalization or re-hospitalization and antibiotic therapy; injury to bowel, bladder, ureters and major vessels or other surrounding organs; formation of adhesions; need for additional procedures including laparotomy; thromboembolic phenomenon; incisional problems and other postoperative or anesthesia complications.  Patient was told that the likelihood that her condition and symptoms will be treated effectively with this surgical management was very high; the postoperative expectations were also discussed in detail. The patient also understands the alternative treatment options which were discussed in full. All questions were answered.

## 2019-09-23 NOTE — Discharge Instructions (Signed)
Dilation and Curettage or Vacuum Curettage, Care After These instructions give you information about caring for yourself after your procedure. Your doctor may also give you more specific instructions. Call your doctor if you have any problems or questions after your procedure. Follow these instructions at home: Activity  Do not drive or use heavy machinery while taking prescription pain medicine.  For 24 hours after your procedure, avoid driving.  Take short walks often, followed by rest periods. Ask your doctor what activities are safe for you. After one or two days, you may be able to return to your normal activities.  Do not lift anything that is heavier than 10 lb (4.5 kg) until your doctor approves.  For at least 2 weeks, or as long as told by your doctor: ? Do not douche. ? Do not use tampons. ? Do not have sex. General instructions   Take over-the-counter and prescription medicines only as told by your doctor. This is very important if you take blood thinning medicine.  Do not take baths, swim, or use a hot tub until your doctor approves. Take showers instead of baths.  Wear compression stockings as told by your doctor.  It is up to you to get the results of your procedure. Ask your doctor when your results will be ready.  Keep all follow-up visits as told by your doctor. This is important. Contact a doctor if:  You have very bad cramps that get worse or do not get better with medicine.  You have very bad pain in your belly (abdomen).  You cannot drink fluids without throwing up (vomiting).  You get pain in a different part of the area between your belly and thighs (pelvis).  You have bad-smelling discharge from your vagina.  You have a rash. Get help right away if:  You are bleeding a lot from your vagina. A lot of bleeding means soaking more than one sanitary pad in an hour, for 2 hours in a row.  You have clumps of blood (blood clots) coming from your  vagina.  You have a fever or chills.  Your belly feels very tender or hard.  You have chest pain.  You have trouble breathing.  You cough up blood.  You feel dizzy.  You feel light-headed.  You pass out (faint).  You have pain in your neck or shoulder area. Summary  Take short walks often, followed by rest periods. Ask your doctor what activities are safe for you. After one or two days, you may be able to return to your normal activities.  Do not lift anything that is heavier than 10 lb (4.5 kg) until your doctor approves.  Do not take baths, swim, or use a hot tub until your doctor approves. Take showers instead of baths.  Contact your doctor if you have any symptoms of infection, like bad-smelling discharge from your vagina. This information is not intended to replace advice given to you by your health care provider. Make sure you discuss any questions you have with your health care provider. Document Revised: 03/06/2017 Document Reviewed: 12/10/2015 Elsevier Patient Education  2020 [employer] Molar Pregnancy  A molar pregnancy (hydatidiform mole) is a mass of tissue that grows in the uterus after an egg is fertilized incorrectly. The mass does not develop into a fetus, and is considered an abnormal pregnancy. Usually, the pregnancy ends on its own through miscarriage. In some cases, treatment may be required. What are the causes? This condition is caused by an  egg that is fertilized incorrectly so that it has abnormal genetic material (chromosomes). This can result in one of two types of molar pregnancies:  Complete molar pregnancy. This is when all of the chromosomes in the fertilized egg are from the father, and none are from the mother.  Partial molar pregnancy. This is when the fertilized egg has chromosomes from the father and mother, but it has too many chromosomes. What increases the risk? This condition is more likely to develop in:  Women who are over the  age of 5 or under the age of 5.  Women who have had a molar pregnancy in the past (very rare). Other possible risk factors include:  Smoking more than 15 cigarettes a day.  History of infertility.  Having a blood type A, B, or AB.  Having a lack (deficiency) of vitamin A.  Using birth control pills (oral contraceptives). What are the signs or symptoms? Symptoms of this condition include:  Vaginal bleeding.  Missed menstrual period.  The uterus growing faster than expected for a normal pregnancy.  Severe nausea and vomiting.  Severe pressure or pain in the uterus.  Abnormal ovarian cysts (theca lutein cysts).  Vaginal discharge that looks like grapes.  High blood pressure (early onset of preeclampsia).  Overactive thyroid gland (hyperthyroidism).  Not having enough red blood cells or hemoglobin (anemia). How is this diagnosed? This condition is diagnosed based on ultrasound and blood tests. How is this treated? Usually, molar pregnancies end on their own by miscarriage. A health care provider may manage this condition by:  Monitoring the levels of pregnancy hormones in your blood to make sure that the hormone levels are decreasing as expected.  Giving you a medicine called Rho (D) immune globulin. This medicine helps to prevent problems that may occur in future pregnancies as a result of a protein on red blood cells (Rh factor). You may be given this medicine if you do not have an Rh factor (you are Rh negative) and your sex partner has an Rh factor (he is Rh positive).  Putting you on chemotherapy. This involves taking medicines that regulate levels of pregnancy hormones. This may be done if your pregnancy hormone levels are not decreasing as expected.  Performing a procedure called dilation and curettage (D&C), or vacuum curettage. These are minor procedures that involve scraping or suctioning the molar pregnancy out of the uterus and removing it through the vagina.  Even if a molar pregnancy ends on its own, one of these procedures may be done to make sure that all the abnormal tissue is out of the uterus.  Doing a surgical removal of the uterus (hysterectomy). Follow these instructions at home:  Avoid getting pregnant for 6-12 months, or as long as told by your health care provider. To avoid getting pregnant, avoid having sex or use a reliable form of birth control every time you have sex.  Take over-the-counter and prescription medicines only as told by your health care provider.  Rest as needed, and slowly return to your normal activities.  Think about joining a support group. If you are struggling with grief, ask your health care provider for help.  Keep all follow-up visits as told by your health care provider. This is important. You may need follow-up blood tests or ultrasounds. Contact a health care provider if:  You continue to have irregular vaginal bleeding.  You have abdominal pain. Summary  A molar pregnancy (hydatidiform mole) is a mass of tissue that grows in  the uterus after an egg is fertilized incorrectly.  This condition is more likely to develop in women who are over the age of 39 or under the age of 59 or women who have had a molar pregnancy in the past.  The most common symptom of this condition is vaginal bleeding.  Usually, molar pregnancy ends with a miscarriage, and no treatment is needed. This information is not intended to replace advice given to you by your health care provider. Make sure you discuss any questions you have with your health care provider. Document Revised: 03/06/2017 Document Reviewed: 05/28/2016 Elsevier Patient Education  2020 [employer].

## 2019-09-23 NOTE — Transfer of Care (Signed)
Immediate Anesthesia Transfer of Care Note  Patient: Yolanda Harmon  Procedure(s) Performed: DILATATION AND EVACUATION (N/A Vagina ) OPERATIVE ULTRASOUND (N/A Vagina )  Patient Location: PACU  Anesthesia Type:MAC  Level of Consciousness: awake  Airway & Oxygen Therapy: Patient Spontanous Breathing and Patient connected to nasal cannula oxygen  Post-op Assessment: Report given to RN and Post -op Vital signs reviewed and stable  Post vital signs: Reviewed and stable  Last Vitals:  Vitals Value Taken Time  BP 102/63 09/23/19 1115  Temp    Pulse 65 09/23/19 1117  Resp 16 09/23/19 1117  SpO2 100 % 09/23/19 1117  Vitals shown include unvalidated device data.  Last Pain:  Vitals:   09/23/19 0827  TempSrc:   PainSc: 0-No pain      Patients Stated Pain Goal: 4 (42/70/62 3762)  Complications: No complications documented.

## 2019-09-23 NOTE — Anesthesia Postprocedure Evaluation (Signed)
Anesthesia Post Note  Patient: Yolanda Harmon  Procedure(s) Performed: DILATATION AND EVACUATION (N/A Vagina ) OPERATIVE ULTRASOUND (N/A Vagina )     Patient location during evaluation: PACU Anesthesia Type: MAC Level of consciousness: awake and alert and oriented Pain management: pain level controlled Vital Signs Assessment: post-procedure vital signs reviewed and stable Respiratory status: spontaneous breathing, nonlabored ventilation and respiratory function stable Cardiovascular status: blood pressure returned to baseline Postop Assessment: no apparent nausea or vomiting Anesthetic complications: no   No complications documented.  Last Vitals:  Vitals:   09/23/19 0814 09/23/19 1115  BP: (!) 111/52 102/63  Pulse: 69 64  Resp: 20 12  Temp: 37.1 C (!) 36.1 C  SpO2: 100% 100%    Last Pain:  Vitals:   09/23/19 1115  TempSrc:   PainSc: 0-No pain                 Brennan Bailey

## 2019-09-23 NOTE — Op Note (Signed)
[facility] PROCEDURE DATE: 09/23/2019  PREOPERATIVE DIAGNOSIS: 8 week molar pregnancy POSTOPERATIVE DIAGNOSIS: The same PROCEDURE:     Dilation and Evacuation SURGEON:  Scheryl Darter MD  INDICATIONS: 24 y.o. G2P1001 with molar pregnancy at [redacted] weeks gestation, needing surgical management.  Risks of surgery were discussed with the patient including but not limited to: bleeding which may require transfusion; infection which may require antibiotics; injury to uterus or surrounding organs; need for additional procedures including laparotomy or laparoscopy; possibility of intrauterine scarring which may impair future fertility; and other postoperative/anesthesia complications. Written informed consent was obtained.    FINDINGS:  A 14 week size uterus, large amount of products of conception, specimen sent to pathology.  ANESTHESIA:    Monitored intravenous sedation, paracervical block. INTRAVENOUS FLUIDS:  1000 ml of LR ESTIMATED BLOOD LOSS:  Less than 50 ml. SPECIMENS:  Products of conception sent to pathology COMPLICATIONS:  None immediate.  PROCEDURE DETAILS:  The patient received intravenous Doxycycline while in the preoperative area.  She was then taken to the operating room where monitored intravenous sedation was administered and was found to be adequate.  After an adequate timeout was performed, she was placed in the dorsal lithotomy position and examined; then prepped and draped in the sterile manner.   Her bladder was catheterized for an unmeasured amount of clear, yellow urine. A vaginal speculum was then placed in the patient's vagina and a single tooth tenaculum was applied to the anterior lip of the cervix.  A paracervical block using 10 ml of 0.5% Marcaine was administered. The cervix was gently dilated to accommodate a 10 mm suction curette that was gently advanced to the uterine fundus. Ultrasound guidance was used throughout the procedure. The suction device was then activated and  curette slowly rotated to clear the uterus of products of conception. There was minimal bleeding noted and the tenaculum removed with good hemostasis noted.   All instruments were removed from the patient's vagina.  Sponge and instrument counts were correct times two  The patient tolerated the procedure well and was taken to the recovery area awake, and in stable condition.  Adam Phenix, MD 09/23/2019 11:21 AM

## 2019-09-24 ENCOUNTER — Encounter (HOSPITAL_COMMUNITY): Payer: Self-pay | Admitting: Obstetrics & Gynecology

## 2019-09-28 LAB — SURGICAL PATHOLOGY

## 2019-10-12 ENCOUNTER — Ambulatory Visit: Payer: Self-pay | Admitting: Obstetrics & Gynecology

## 2019-11-03 ENCOUNTER — Other Ambulatory Visit (HOSPITAL_COMMUNITY)
Admission: RE | Admit: 2019-11-03 | Discharge: 2019-11-03 | Disposition: A | Discharge status: Home / Self Care | Payer: Self-pay | Source: Ambulatory Visit | Attending: Obstetrics & Gynecology | Admitting: Obstetrics & Gynecology

## 2019-11-03 ENCOUNTER — Encounter: Payer: Self-pay | Admitting: Obstetrics & Gynecology

## 2019-11-03 ENCOUNTER — Other Ambulatory Visit: Payer: Self-pay

## 2019-11-03 ENCOUNTER — Ambulatory Visit (INDEPENDENT_AMBULATORY_CARE_PROVIDER_SITE_OTHER): Payer: Self-pay | Admitting: Obstetrics & Gynecology

## 2019-11-03 VITALS — BP 107/75 | HR 79 | Wt 128.8 lb

## 2019-11-03 DIAGNOSIS — N898 Other specified noninflammatory disorders of vagina: Secondary | ICD-10-CM

## 2019-11-03 DIAGNOSIS — O020 Blighted ovum and nonhydatidiform mole: Secondary | ICD-10-CM

## 2019-11-03 LAB — POCT URINALYSIS DIP (DEVICE)
Bilirubin Urine: NEGATIVE
Glucose, UA: NEGATIVE mg/dL
Ketones, ur: NEGATIVE mg/dL
Leukocytes,Ua: NEGATIVE
Nitrite: NEGATIVE
Protein, ur: NEGATIVE mg/dL
Specific Gravity, Urine: 1.025 (ref 1.005–1.030)
Urobilinogen, UA: 0.2 mg/dL (ref 0.0–1.0)
pH: 5.5 (ref 5.0–8.0)

## 2019-11-03 NOTE — Progress Notes (Signed)
Subjective:vaginal discharge, recent bleeding     Yolanda Harmon is a 24 y.o. female who presents to the clinic 6 weeks status post D&E for molar pregnancy   . Eating a regular diet without difficulty. Bowel movements are normal. The patient is not having any pain.  The following portions of the patient's history were reviewed and updated as appropriate: allergies, current medications, past family history, past medical history, past social history, past surgical history and problem list.  Review of Systems vaginal discharge    Objective:    BP 107/75    Pulse 79    Wt 128 lb 12.8 oz (58.4 kg)    LMP  (LMP Unknown)    SpO2 100%    Breastfeeding No    BMI 22.11 kg/m  General:  alert, cooperative and no distress  Abdomen: soft   Pelvic exam: normal external genitalia, vulva, vagina, cervix, uterus and adnexa. Mucus discharge, lab obtained      Assessment:    Doing well postoperatively. Operative findings again reviewed. Pathology report discussed.    Plan:    1. Continue any current medications. 2. Wound care discussed. 3. Activity restrictions: none 4. Anticipated return to work: now. 5. Follow up: 2 weeks repeat HCG 6. Contraception for at least 6 months. She states she will use condoms and spermacide.   Adam Phenix, MD 11/03/2019

## 2019-11-03 NOTE — Patient Instructions (Signed)
Molar Pregnancy  A molar pregnancy (hydatidiform mole) is a mass of tissue that grows in the uterus after an egg is fertilized incorrectly. The mass does not develop into a fetus, and is considered an abnormal pregnancy. Usually, the pregnancy ends on its own through miscarriage. In some cases, treatment may be required. What are the causes? This condition is caused by an egg that is fertilized incorrectly so that it has abnormal genetic material (chromosomes). This can result in one of two types of molar pregnancies:  Complete molar pregnancy. This is when all of the chromosomes in the fertilized egg are from the father, and none are from the mother.  Partial molar pregnancy. This is when the fertilized egg has chromosomes from the father and mother, but it has too many chromosomes. What increases the risk? This condition is more likely to develop in:  Women who are over the age of 57 or under the age of 42.  Women who have had a molar pregnancy in the past (very rare). Other possible risk factors include:  Smoking more than 15 cigarettes a day.  History of infertility.  Having a blood type A, B, or AB.  Having a lack (deficiency) of vitamin A.  Using birth control pills (oral contraceptives). What are the signs or symptoms? Symptoms of this condition include:  Vaginal bleeding.  Missed menstrual period.  The uterus growing faster than expected for a normal pregnancy.  Severe nausea and vomiting.  Severe pressure or pain in the uterus.  Abnormal ovarian cysts (theca lutein cysts).  Vaginal discharge that looks like grapes.  High blood pressure (early onset of preeclampsia).  Overactive thyroid gland (hyperthyroidism).  Not having enough red blood cells or hemoglobin (anemia). How is this diagnosed? This condition is diagnosed based on ultrasound and blood tests. How is this treated? Usually, molar pregnancies end on their own by miscarriage. A health care provider  may manage this condition by:  Monitoring the levels of pregnancy hormones in your blood to make sure that the hormone levels are decreasing as expected.  Giving you a medicine called Rho (D) immune globulin. This medicine helps to prevent problems that may occur in future pregnancies as a result of a protein on red blood cells (Rh factor). You may be given this medicine if you do not have an Rh factor (you are Rh negative) and your sex partner has an Rh factor (he is Rh positive).  Putting you on chemotherapy. This involves taking medicines that regulate levels of pregnancy hormones. This may be done if your pregnancy hormone levels are not decreasing as expected.  Performing a procedure called dilation and curettage (D&C), or vacuum curettage. These are minor procedures that involve scraping or suctioning the molar pregnancy out of the uterus and removing it through the vagina. Even if a molar pregnancy ends on its own, one of these procedures may be done to make sure that all the abnormal tissue is out of the uterus.  Doing a surgical removal of the uterus (hysterectomy). Follow these instructions at home:  Avoid getting pregnant for 6-12 months, or as long as told by your health care provider. To avoid getting pregnant, avoid having sex or use a reliable form of birth control every time you have sex.  Take over-the-counter and prescription medicines only as told by your health care provider.  Rest as needed, and slowly return to your normal activities.  Think about joining a support group. If you are struggling with grief, ask  your health care provider for help.  Keep all follow-up visits as told by your health care provider. This is important. You may need follow-up blood tests or ultrasounds. Contact a health care provider if:  You continue to have irregular vaginal bleeding.  You have abdominal pain. Summary  A molar pregnancy (hydatidiform mole) is a mass of tissue that grows in  the uterus after an egg is fertilized incorrectly.  This condition is more likely to develop in women who are over the age of 76 or under the age of 45 or women who have had a molar pregnancy in the past.  The most common symptom of this condition is vaginal bleeding.  Usually, molar pregnancy ends with a miscarriage, and no treatment is needed. This information is not intended to replace advice given to you by your health care provider. Make sure you discuss any questions you have with your health care provider. Document Revised: 03/06/2017 Document Reviewed: 05/28/2016 Elsevier Patient Education  2020 [employer].

## 2019-11-04 ENCOUNTER — Telehealth: Payer: Self-pay

## 2019-11-04 LAB — CERVICOVAGINAL ANCILLARY ONLY
Bacterial Vaginitis (gardnerella): NEGATIVE
Candida Glabrata: NEGATIVE
Candida Vaginitis: NEGATIVE
Chlamydia: NEGATIVE
Comment: NEGATIVE
Comment: NEGATIVE
Comment: NEGATIVE
Comment: NEGATIVE
Comment: NEGATIVE
Comment: NORMAL
Neisseria Gonorrhea: NEGATIVE
Trichomonas: NEGATIVE

## 2019-11-04 LAB — BETA HCG QUANT (REF LAB): hCG Quant: 52 m[IU]/mL

## 2019-11-04 NOTE — Telephone Encounter (Addendum)
-----   Message from Yolanda Phenix, MD sent at 11/04/2019 12:02 PM EDT ----- Repeat as scheduled for mole f/u  Notified pt with Spanish Intepreter Raquel M., beta results and to continue f/u lab at her appt scheduled on 11/17/19 @ 1030.  Pt verbalized understanding with no further questions.   Addison Naegeli, RN  11/04/19

## 2019-11-17 ENCOUNTER — Other Ambulatory Visit: Payer: Self-pay

## 2019-11-17 ENCOUNTER — Other Ambulatory Visit: Payer: Self-pay | Admitting: *Deleted

## 2019-11-17 DIAGNOSIS — O020 Blighted ovum and nonhydatidiform mole: Secondary | ICD-10-CM

## 2019-12-19 ENCOUNTER — Other Ambulatory Visit: Payer: Self-pay

## 2019-12-19 DIAGNOSIS — O020 Blighted ovum and nonhydatidiform mole: Secondary | ICD-10-CM

## 2019-12-20 ENCOUNTER — Other Ambulatory Visit: Payer: Self-pay | Admitting: Obstetrics & Gynecology

## 2019-12-20 ENCOUNTER — Telehealth: Payer: Self-pay

## 2019-12-20 DIAGNOSIS — O020 Blighted ovum and nonhydatidiform mole: Secondary | ICD-10-CM

## 2019-12-20 LAB — BETA HCG QUANT (REF LAB): hCG Quant: 4 m[IU]/mL

## 2019-12-20 MED ORDER — NORETHIN ACE-ETH ESTRAD-FE 1-20 MG-MCG(24) PO TABS: 1.0000 | ORAL_TABLET | Freq: Every day | ORAL | 11 refills | Status: DC

## 2019-12-20 NOTE — Telephone Encounter (Signed)
Birth control pills have been sent to pt's pharmacy per Debroah Loop, MD. Called pt to notify her.

## 2019-12-20 NOTE — Telephone Encounter (Addendum)
-----   Message from Adam Phenix, MD sent at 12/20/2019 11:46 AM EDT ----- Repeat every month for 6 months   Called pt; results and provider recommendation given to pt. Pt agreeable. Pt reports abdominal pain and bleeding like a period that began on Friday, changing pad 4x/day. Pt reports LMP about a month prior. Explained this sounds like a menstrual period due to symptoms and timing. Encouraged pt to follow up with our office if this continues longer than a normal period. Front office notified to schedule non-stat beta lab appt in 1 month. Pt expresses interest in OCPs. Per chart review Debroah Loop, MD discussed birth control at post-op, pt stated she would use condoms at that time. Message sent to Debroah Loop, MD regarding contraception options.

## 2020-01-16 ENCOUNTER — Emergency Department (HOSPITAL_COMMUNITY)
Admission: EM | Admit: 2020-01-16 | Discharge: 2020-01-17 | Disposition: A | Discharge status: Home / Self Care | Payer: Self-pay | Attending: Emergency Medicine | Admitting: Emergency Medicine

## 2020-01-16 ENCOUNTER — Other Ambulatory Visit: Payer: Self-pay

## 2020-01-16 ENCOUNTER — Encounter (HOSPITAL_COMMUNITY): Payer: Self-pay | Admitting: Emergency Medicine

## 2020-01-16 DIAGNOSIS — G8929 Other chronic pain: Secondary | ICD-10-CM | POA: Insufficient documentation

## 2020-01-16 DIAGNOSIS — R197 Diarrhea, unspecified: Secondary | ICD-10-CM | POA: Insufficient documentation

## 2020-01-16 DIAGNOSIS — Z5321 Procedure and treatment not carried out due to patient leaving prior to being seen by health care provider: Secondary | ICD-10-CM | POA: Insufficient documentation

## 2020-01-16 LAB — COMPREHENSIVE METABOLIC PANEL
ALT: 17 U/L (ref 0–44)
AST: 20 U/L (ref 15–41)
Albumin: 4.5 g/dL (ref 3.5–5.0)
Alkaline Phosphatase: 45 U/L (ref 38–126)
Anion gap: 9 (ref 5–15)
BUN: 8 mg/dL (ref 6–20)
CO2: 26 mmol/L (ref 22–32)
Calcium: 9.5 mg/dL (ref 8.9–10.3)
Chloride: 105 mmol/L (ref 98–111)
Creatinine, Ser: 0.75 mg/dL (ref 0.44–1.00)
GFR, Estimated: >60 mL/min (ref >60)
Glucose, Bld: 108 mg/dL — ABNORMAL HIGH (ref 70–99)
Potassium: 3.6 mmol/L (ref 3.5–5.1)
Sodium: 140 mmol/L (ref 135–145)
Total Bilirubin: 0.7 mg/dL (ref 0.3–1.2)
Total Protein: 7.7 g/dL (ref 6.5–8.1)

## 2020-01-16 LAB — CBC
HCT: 41.7 % (ref 36.0–46.0)
Hemoglobin: 13.2 g/dL (ref 12.0–15.0)
MCH: 28.5 pg (ref 26.0–34.0)
MCHC: 31.7 g/dL (ref 30.0–36.0)
MCV: 90.1 fL (ref 80.0–100.0)
Platelets: 274 10*3/uL (ref 150–400)
RBC: 4.63 MIL/uL (ref 3.87–5.11)
RDW: 12.2 % (ref 11.5–15.5)
WBC: 5.8 10*3/uL (ref 4.0–10.5)
nRBC: 0 % (ref 0.0–0.2)

## 2020-01-16 LAB — LIPASE, BLOOD: Lipase: 42 U/L (ref 11–51)

## 2020-01-16 LAB — URINALYSIS, ROUTINE W REFLEX MICROSCOPIC
Bacteria, UA: NONE SEEN
Bilirubin Urine: NEGATIVE
Glucose, UA: >=500 mg/dL — AB
Hgb urine dipstick: NEGATIVE
Ketones, ur: 20 mg/dL — AB
Nitrite: NEGATIVE
Protein, ur: NEGATIVE mg/dL
Specific Gravity, Urine: 1.025 (ref 1.005–1.030)
pH: 5 (ref 5.0–8.0)

## 2020-01-16 LAB — I-STAT BETA HCG BLOOD, ED (MC, WL, AP ONLY): I-stat hCG, quantitative: <5 m[IU]/mL (ref <5)

## 2020-01-16 NOTE — ED Triage Notes (Signed)
Pt reports for over 2 months now she has been having chronic diarrhea, states sometimes immediatly after eating will have to go to restroom and have loose stool.  Pt also states one week ago she began to have extreme pain with urination describes that as "giving birth pain with urination".

## 2020-01-17 ENCOUNTER — Other Ambulatory Visit: Payer: Self-pay

## 2020-01-17 NOTE — ED Notes (Signed)
Pt called 3x no answer  

## 2020-02-06 DIAGNOSIS — O020 Blighted ovum and nonhydatidiform mole: Secondary | ICD-10-CM

## 2020-02-06 HISTORY — DX: Blighted ovum and nonhydatidiform mole: O02.0

## 2020-02-26 ENCOUNTER — Emergency Department (HOSPITAL_COMMUNITY)
Admission: EM | Admit: 2020-02-26 | Discharge: 2020-02-26 | Disposition: A | Discharge status: Home / Self Care | Payer: Self-pay | Attending: Emergency Medicine | Admitting: Emergency Medicine

## 2020-02-26 ENCOUNTER — Emergency Department (HOSPITAL_COMMUNITY): Payer: Self-pay

## 2020-02-26 ENCOUNTER — Encounter (HOSPITAL_COMMUNITY): Payer: Self-pay | Admitting: Emergency Medicine

## 2020-02-26 ENCOUNTER — Other Ambulatory Visit: Payer: Self-pay

## 2020-02-26 DIAGNOSIS — R197 Diarrhea, unspecified: Secondary | ICD-10-CM | POA: Insufficient documentation

## 2020-02-26 DIAGNOSIS — R1084 Generalized abdominal pain: Secondary | ICD-10-CM | POA: Insufficient documentation

## 2020-02-26 LAB — I-STAT BETA HCG BLOOD, ED (MC, WL, AP ONLY): I-stat hCG, quantitative: <5 m[IU]/mL (ref <5)

## 2020-02-26 LAB — URINALYSIS, ROUTINE W REFLEX MICROSCOPIC
Bacteria, UA: NONE SEEN
Bilirubin Urine: NEGATIVE
Glucose, UA: NEGATIVE mg/dL
Hgb urine dipstick: NEGATIVE
Ketones, ur: 20 mg/dL — AB
Nitrite: NEGATIVE
Protein, ur: NEGATIVE mg/dL
Specific Gravity, Urine: 1.028 (ref 1.005–1.030)
pH: 7 (ref 5.0–8.0)

## 2020-02-26 LAB — COMPREHENSIVE METABOLIC PANEL
ALT: 24 U/L (ref 0–44)
AST: 26 U/L (ref 15–41)
Albumin: 4.8 g/dL (ref 3.5–5.0)
Alkaline Phosphatase: 47 U/L (ref 38–126)
Anion gap: 3 — ABNORMAL LOW (ref 5–15)
BUN: 10 mg/dL (ref 6–20)
CO2: 28 mmol/L (ref 22–32)
Calcium: 9.2 mg/dL (ref 8.9–10.3)
Chloride: 104 mmol/L (ref 98–111)
Creatinine, Ser: 0.73 mg/dL (ref 0.44–1.00)
GFR, Estimated: >60 mL/min (ref >60)
Glucose, Bld: 94 mg/dL (ref 70–99)
Potassium: 4.2 mmol/L (ref 3.5–5.1)
Sodium: 135 mmol/L (ref 135–145)
Total Bilirubin: 1 mg/dL (ref 0.3–1.2)
Total Protein: 7.9 g/dL (ref 6.5–8.1)

## 2020-02-26 LAB — CBC
HCT: 42.5 % (ref 36.0–46.0)
Hemoglobin: 13.6 g/dL (ref 12.0–15.0)
MCH: 28.3 pg (ref 26.0–34.0)
MCHC: 32 g/dL (ref 30.0–36.0)
MCV: 88.5 fL (ref 80.0–100.0)
Platelets: 274 10*3/uL (ref 150–400)
RBC: 4.8 MIL/uL (ref 3.87–5.11)
RDW: 13.2 % (ref 11.5–15.5)
WBC: 6.8 10*3/uL (ref 4.0–10.5)
nRBC: 0 % (ref 0.0–0.2)

## 2020-02-26 LAB — LIPASE, BLOOD: Lipase: 37 U/L (ref 11–51)

## 2020-02-26 MED ORDER — IOHEXOL 300 MG/ML  SOLN
100.0000 mL | Freq: Once | INTRAMUSCULAR | Status: AC | PRN
  Administered 2020-02-26: 100 mL via INTRAVENOUS

## 2020-02-26 MED ORDER — DICYCLOMINE HCL 10 MG/ML IM SOLN
20.0000 mg | Freq: Once | INTRAMUSCULAR | Status: AC
  Administered 2020-02-26: 20 mg via INTRAMUSCULAR
  Filled 2020-02-26: qty 2

## 2020-02-26 MED ORDER — FAMOTIDINE IN NACL 20-0.9 MG/50ML-% IV SOLN
20.0000 mg | Freq: Once | INTRAVENOUS | Status: AC
  Administered 2020-02-26: 20 mg via INTRAVENOUS
  Filled 2020-02-26: qty 50

## 2020-02-26 MED ORDER — ONDANSETRON 4 MG PO TBDP: 4.0000 mg | ORAL_TABLET | Freq: Three times a day (TID) | ORAL | 0 refills | Status: DC | PRN

## 2020-02-26 MED ORDER — FAMOTIDINE 20 MG PO TABS: 20.0000 mg | ORAL_TABLET | Freq: Every day | ORAL | 0 refills | Status: DC

## 2020-02-26 MED ORDER — DICYCLOMINE HCL 20 MG PO TABS: 20.0000 mg | ORAL_TABLET | Freq: Two times a day (BID) | ORAL | 0 refills | Status: DC | PRN

## 2020-02-26 MED ORDER — ONDANSETRON HCL 4 MG/2ML IJ SOLN
4.0000 mg | Freq: Once | INTRAMUSCULAR | Status: AC
  Administered 2020-02-26: 4 mg via INTRAVENOUS
  Filled 2020-02-26: qty 2

## 2020-02-26 MED ORDER — SODIUM CHLORIDE 0.9 % IV BOLUS
1000.0000 mL | Freq: Once | INTRAVENOUS | Status: AC
  Administered 2020-02-26: 1000 mL via INTRAVENOUS

## 2020-02-26 NOTE — ED Provider Notes (Signed)
[facility]-EMERGENCY DEPT Provider Note   CSN: [id number] Arrival date & time: 02/26/20  1028     History Chief Complaint  Patient presents with   Diarrhea   Abdominal Pain    Yolanda Harmon is a 24 y.o. female.  Yolanda Harmon is a 24 y.o. female with history of previous molar pregnancy, who presents to the emergency department for evaluation of diarrhea and abdominal pain.  Patient states she has been dealing with intermittent episodes of diarrhea and abdominal pain for over a year now, she was initially seeing [facility] for the [facility], she states that she saw them a few times, they did blood work and gave her medicine for her diarrhea that she states never helped.  She reports that she did not see much improvement with these evaluations so never followed up with them again.  She states that she is continued to have the symptoms, but over the past 2 weeks she has had significantly worsened abdominal pain and diarrhea.  She states daily abdominal pain that comes and goes, typically in 10-minute intervals.  She reports at least 4 episodes of watery diarrhea daily, sometimes more.  Denies any blood in the stool.  No fevers or chills.  Has not had any nausea or vomiting.  Reports pain is usually generalized, sometimes worse in the lower abdomen.  She states they did lab work but never did any imaging she has never had endoscopy or colonoscopy when following with GI and does not know the name of the medicine they tried for her.  Denies any dysuria or urinary frequency.  No vaginal discharge, vaginal bleeding or pelvic pain.  Aside from molar pregnancy surgery no other abdominal surgeries.  Really wish that we could see [facility]        History reviewed. No pertinent past medical history.  Patient Active Problem List   Diagnosis Date Noted   Molar pregnancy 09/23/2019    Past Surgical History:  Procedure Laterality Date   DILATION AND EVACUATION  N/A 09/23/2019   Procedure: DILATATION AND EVACUATION;  Surgeon: Adam Phenix, MD;  Location[facility] OR;  Service: Gynecology;  Laterality: N/A;   ETONOGESTREL IMPLANT SYSTEM  12/28/2014       OPERATIVE ULTRASOUND N/A 09/23/2019   Procedure: OPERATIVE ULTRASOUND;  Surgeon: Adam Phenix, MD;  Location: [facility] OR;  Service: Gynecology;  Laterality: N/A;     OB History    Gravida  2   Para  1   Term  1   Preterm      AB      Living  1     SAB      TAB      Ectopic      Multiple  0   Live Births  1           No family history on file.  Social History   Tobacco Use   Smoking status: Never Smoker   Smokeless tobacco: Never Used  Vaping Use   Vaping Use: Never used  Substance Use Topics   Alcohol use: No   Drug use: No    Home Medications Prior to Admission medications   Medication Sig Start Date End Date Taking? Authorizing Provider  clindamycin (CLEOCIN T) 1 % SWAB Apply 1 Dose topically in the morning and at bedtime. 08/31/19   [provider]  doxycycline (VIBRAMYCIN) 100 MG capsule Take 100 mg by mouth daily. Patient not taking: Reported on 11/03/2019 08/31/19  [provider]  ibuprofen (ADVIL) 600 MG tablet Take 1 tablet (600 mg total) by mouth every 6 (six) hours as needed. 09/23/19   Adam Phenix, MD  Norethindrone Acetate-Ethinyl Estrad-FE (LOESTRIN 24 FE) 1-20 MG-MCG(24) tablet Take 1 tablet by mouth daily. 12/20/19   Adam Phenix, MD  oxyCODONE-acetaminophen (PERCOCET/ROXICET) 5-325 MG tablet Take 1-2 tablets by mouth every 6 (six) hours as needed. Patient not taking: Reported on 11/03/2019 09/23/19   Adam Phenix, MD  tretinoin (RETIN-A) 0.025 % cream Apply 1 Dose topically at bedtime. 06/23/19   [provider]    Allergies    Patient has no known allergies.  Review of Systems   Review of Systems  Constitutional: Negative for chills and fever.  HENT: Negative.   Respiratory: Negative for cough and shortness  of breath.   Cardiovascular: Negative for chest pain.  Gastrointestinal: Positive for abdominal pain and diarrhea. Negative for blood in stool, nausea and vomiting.  Genitourinary: Negative for dysuria, frequency, vaginal bleeding and vaginal discharge.  Musculoskeletal: Negative for arthralgias and myalgias.  Skin: Negative for color change and rash.  Neurological: Negative for dizziness, syncope and light-headedness.  All other systems reviewed and are negative.   Physical Exam Updated Vital Signs BP 116/79 (BP Location: Left Arm)    Pulse 92    Temp 98.2 F (36.8 C) (Oral)    Resp 16    Ht 5\' 4"  (1.626 m)    Wt 58.2 kg    LMP  (LMP Unknown)    SpO2 98%    BMI 22.02 kg/m   Physical Exam Vitals and nursing note reviewed.  Constitutional:      General: She is not in acute distress.    Appearance: She is well-developed. She is not diaphoretic.     Comments: Patient tearful, but alert, well-appearing and in no acute distress  HENT:     Head: Normocephalic and atraumatic.     Mouth/Throat:     Mouth: Mucous membranes are moist.     Pharynx: Oropharynx is clear.  Eyes:     General:        Right eye: No discharge.        Left eye: No discharge.  Cardiovascular:     Rate and Rhythm: Normal rate and regular rhythm.     Heart sounds: Normal heart sounds. No murmur heard.  No friction rub. No gallop.   Pulmonary:     Effort: Pulmonary effort is normal. No respiratory distress.     Breath sounds: Normal breath sounds. No wheezing or rales.     Comments: Respirations equal and unlabored, patient able to speak in full sentences, lungs clear to auscultation bilaterally Abdominal:     General: Bowel sounds are normal. There is no distension.     Palpations: Abdomen is soft. There is no mass.     Tenderness: There is generalized abdominal tenderness and tenderness in the right lower quadrant, suprapubic area and left lower quadrant. There is no guarding.     Comments: Abdomen is soft,  nondistended, bowel sounds present throughout, there is some generalized tenderness throughout the abdomen, tenderness most notable across the lower abdomen, does not localize to 1 side, no CVA tenderness bilaterally  Musculoskeletal:        General: No deformity.     Cervical back: Neck supple.  Skin:    General: Skin is warm and dry.     Capillary Refill: Capillary refill takes less than 2 seconds.  Neurological:     Mental Status: She is alert.     Coordination: Coordination normal.     Comments: Speech is clear, able to follow commands Moves extremities without ataxia, coordination intact  Psychiatric:        Mood and Affect: Mood normal.        Behavior: Behavior normal.     ED Results / Procedures / Treatments   Labs (all labs ordered are listed, but only abnormal results are displayed) Labs Reviewed  COMPREHENSIVE METABOLIC PANEL - Abnormal; Notable for the following components:      Result Value   Anion gap 3 (*)    All other components within normal limits  LIPASE, BLOOD  CBC  URINALYSIS, ROUTINE W REFLEX MICROSCOPIC  I-STAT BETA HCG BLOOD, ED ([facility], [facility], [facility] ONLY)    EKG None  Radiology No results found.  Procedures Procedures (including critical care time)  Medications Ordered in ED Medications  sodium chloride 0.9 % bolus 1,000 mL (1,000 mLs Intravenous New Bag/Given 02/26/20 1412)  ondansetron (ZOFRAN) injection 4 mg (4 mg Intravenous Given 02/26/20 1413)  famotidine (PEPCID) IVPB 20 mg premix (0 mg Intravenous Stopped 02/26/20 1514)  dicyclomine (BENTYL) injection 20 mg (20 mg Intramuscular Given 02/26/20 1413)  iohexol (OMNIPAQUE) 300 MG/ML solution 100 mL (100 mLs Intravenous Contrast Given 02/26/20 1448)    ED Course  I have reviewed the triage vital signs and the nursing notes.  Pertinent labs & imaging results that were available during my care of the patient were reviewed by me and considered in my medical decision making (see chart for  details).    MDM Rules/Calculators/A&P                          Patient presents to the ED with complaints of abdominal pain. Patient nontoxic appearing, in no apparent distress, vitals WNL. On exam patient with some generalized tenderness, worse in the lower abdomen but does not localize to 1 side, no peritoneal signs. Will evaluate with labs and CT given that patient has not had CT imaging with these worsening symptoms, reviewed chart, last CT done in 2017, she reports no imaging with GI, but has not followed up in several months, worsening pain in the last 2 weeks. Analgesics, anti-emetics, and fluids administered.   ER work-up reviewed:  CBC: No leukocytosis, normal hemoglobin CMP: No electrolyte derangements, normal renal and liver function Lipase: WNL UA: Pending Preg test: Negative   At shift change CT is pending as well as urinalysis.  If these are normal feel patient can be discharged home with supportive treatment with Bentyl, Zofran and Pepcid.  Suspect that patient may have IBS, but certainly given age can be at risk for developing inflammatory bowel disease, will need to continue to follow-up with GI.  At shift change care signed out to [facility] who will follow up on CT results and disposition appropriately.   Final Clinical Impression(s) / ED Diagnoses Final diagnoses:  Generalized abdominal pain  Diarrhea, unspecified type    Rx / DC Orders ED Discharge Orders    None       Dartha Lodge, PA-C 02/26/20 1600    Melene Plan, DO 02/28/20 1538

## 2020-02-26 NOTE — Discharge Instructions (Addendum)
You have been prescribed 3 medications.  The first medication is called Bentyl.  You can take this as needed for management of your diarrhea.  The second medication is called Zofran.  This will help with nausea and vomiting.  Please only take this if you are experiencing significant nausea and vomiting.  The last medication is called Pepcid.  This will help reduce stomach acid and will hopefully help reduce some of your abdominal pain.  If you develop any new or worsening symptoms please return to the emergency department.  I have given you a referral to [facility] gastroenterology.  Please give them a call and set up a new appointment for evaluation.  It was a pleasure to meet you.

## 2020-02-26 NOTE — ED Provider Notes (Signed)
Patient is a 24 year old female whose care was transferred to me at shift change from [facility].  Her HPI is below:  Yolanda Harmon is a 24 y.o. female with history of previous molar pregnancy, who presents to the emergency department for evaluation of diarrhea and abdominal pain.  Patient states she has been dealing with intermittent episodes of diarrhea and abdominal pain for over a year now, she was initially seeing [facility] for the [facility], she states that she saw them a few times, they did blood work and gave her medicine for her diarrhea that she states never helped.  She reports that she did not see much improvement with these evaluations so never followed up with them again.  She states that she is continued to have the symptoms, but over the past 2 weeks she has had significantly worsened abdominal pain and diarrhea.  She states daily abdominal pain that comes and goes, typically in 10-minute intervals.  She reports at least 4 episodes of watery diarrhea daily, sometimes more.  Denies any blood in the stool.  No fevers or chills.  Has not had any nausea or vomiting.  Reports pain is usually generalized, sometimes worse in the lower abdomen.  She states they did lab work but never did any imaging she has never had endoscopy or colonoscopy when following with GI and does not know the name of the medicine they tried for her.  Denies any dysuria or urinary frequency.  No vaginal discharge, vaginal bleeding or pelvic pain.  Aside from molar pregnancy surgery no other abdominal surgeries.  Really wish that we could see [facility]  Physical Exam  BP 105/63   Pulse 68   Temp 98.2 F (36.8 C) (Oral)   Resp 16   Ht 5\' 4"  (1.626 m)   Wt 58.2 kg   LMP  (LMP Unknown)   SpO2 100%   BMI 22.02 kg/m   Physical Exam Vitals and nursing note reviewed.  Constitutional:      General: She is not in acute distress.    Appearance: She is well-developed. She is not diaphoretic.     Comments: Patient  tearful, but alert, well-appearing and in no acute distress  HENT:     Head: Normocephalic and atraumatic.     Mouth/Throat:     Mouth: Mucous membranes are moist.     Pharynx: Oropharynx is clear.  Eyes:     General:        Right eye: No discharge.        Left eye: No discharge.  Cardiovascular:     Rate and Rhythm: Normal rate and regular rhythm.     Heart sounds: Normal heart sounds. No murmur heard.  No friction rub. No gallop.   Pulmonary:     Effort: Pulmonary effort is normal. No respiratory distress.     Breath sounds: Normal breath sounds. No wheezing or rales.     Comments: Respirations equal and unlabored, patient able to speak in full sentences, lungs clear to auscultation bilaterally Abdominal:     General: Bowel sounds are normal. There is no distension.     Palpations: Abdomen is soft. There is no mass.     Tenderness: There is generalized abdominal tenderness and tenderness in the right lower quadrant, suprapubic area and left lower quadrant. There is no guarding.     Comments: Abdomen is soft, nondistended, bowel sounds present throughout, there is some generalized tenderness throughout the abdomen, tenderness most notable across the lower  abdomen, does not localize to 1 side, no CVA tenderness bilaterally  Musculoskeletal:        General: No deformity.     Cervical back: Neck supple.  Skin:    General: Skin is warm and dry.     Capillary Refill: Capillary refill takes less than 2 seconds.  Neurological:     Mental Status: She is alert.     Coordination: Coordination normal.     Comments: Speech is clear, able to follow commands Moves extremities without ataxia, coordination intact  Psychiatric:        Mood and Affect: Mood normal.        Behavior: Behavior normal.  ED Course/Procedures     Procedures  MDM  Patient is a 24 year old female whose care was transferred to me at shift change by Gwinda Passe.  She presents today due to diarrhea and abdominal pain.   Her symptoms are chronic but it sounds as if they have worsened over the past few weeks.  She reports some intermittent nausea without vomiting.  She was previously followed by [facility] gastroenterology but states that she stopped going there.  Patient signed out pending a CT scan as well as a UA.  CT findings noted below.  IMPRESSION:  1. No CT evidence for acute intra-abdominal or pelvic abnormality.  2. Small free fluid in the pelvis.   UA showing moderate leukocytes, 20 ketones, 6-10 white blood cells.  Patient has no urinary complaints at this time.  Basic labs reassuring.  CBC without leukocytosis.  Electrolytes within normal limits.  No elevation in lipase.  I-STAT beta-hCG less than 5.   Feel that patient is safe for discharge at this time and she is agreeable.  We will discharge with a course of Bentyl as well as Zofran and Pepcid.  She requests follow-up with a new gastroenterologist in the area.  We will give her a referral to [facility].   She was given strict return precautions.  Her questions were answered and she was amicable at the time of discharge.  Her vital signs are stable.  She is afebrile, not tachycardic, not hypoxic.      Placido Sou, PA-C 02/26/20 1859    Tilden Fossa, MD 02/26/20 2024

## 2020-02-26 NOTE — ED Triage Notes (Signed)
Patient here from home reporting diarrhea for "a long time now". Report that she has seen a GI dr "and they do not know whats wrong with me". Tearful in triage.

## 2020-02-29 ENCOUNTER — Encounter: Payer: Self-pay | Admitting: Gastroenterology

## 2020-04-07 NOTE — L&D Delivery Note (Signed)
OB/GYN Faculty Practice Delivery Note  Yolanda Harmon is a 25 y.o. G3P1011 s/p svd at [redacted]w[redacted]d. She was admitted for spontaneous onset of labor that began earlier this morning and progressed to q1-60min contractions.   ROM: before delivery with clear fluid GBS Status: positive, PCN started but not completed before delivery Maximum Maternal Temperature: 98.3  Labor Progress: Was 6.5cm in MAU, came to room and was much more uncomfortable. Got into position for epidural but began spontaneously pushing.  Delivery Date/Time: 02/27/21 at 2122 Delivery: Called to room and patient was complete and pushing. Head delivered ROA. No nuchal cord present. Shoulder and body delivered in usual fashion. Infant with spontaneous cry, placed on mother's abdomen, dried and stimulated. Cord clamped x 2 after 1-minute delay, and cut by FOB. Cord blood drawn. Labia, perineum, vagina, and cervix inspected, no laceration found. Placenta delivered spontaneously, intact, with 3-vessel cord. Fundus soft with brisk bleeding. Uterine massage done continuously with Pitocin and methergine given, lower uterine segment swept. Boggy uterus and brisk bleeding continued, so Dr. Charlotta Newton called to bedside and postpartum hemorrhage code called. Bladder emptied with in/out catheter, draining approximately of urine. Bleeding continued. TXA started, second IV started and fluids running. Jada placed by Dr. Charlotta Newton and connected to suction. Fundus firm.    Placenta: spontaneous and intact, sent to L&D Complications: postpartum hemorrhage >1056ml Lacerations: none EBL: Analgesia: none  Postpartum Planning [x]  transfer orders to [facility] [x]  discharge summary started & shared [x]  message to sent to schedule follow-up - [facility] patient, will complete PP care there [x]  lists updated [x]  vaccines UTD  Infant: Boy (no)  APGARs 9/9  [id number]  , CNM, IBCLC Certified Nurse Midwife, [facility] for , [facility] 02/27/2021, 10:41 PM

## 2020-05-03 ENCOUNTER — Encounter: Payer: Self-pay | Admitting: Gastroenterology

## 2020-05-03 ENCOUNTER — Ambulatory Visit: Payer: Self-pay | Admitting: Gastroenterology

## 2020-05-03 ENCOUNTER — Other Ambulatory Visit: Payer: Self-pay

## 2020-05-03 VITALS — BP 84/60 | HR 71 | Ht 64.0 in | Wt 129.0 lb

## 2020-05-03 DIAGNOSIS — R1030 Lower abdominal pain, unspecified: Secondary | ICD-10-CM

## 2020-05-03 DIAGNOSIS — K625 Hemorrhage of anus and rectum: Secondary | ICD-10-CM

## 2020-05-03 DIAGNOSIS — K219 Gastro-esophageal reflux disease without esophagitis: Secondary | ICD-10-CM

## 2020-05-03 DIAGNOSIS — K529 Noninfective gastroenteritis and colitis, unspecified: Secondary | ICD-10-CM

## 2020-05-03 DIAGNOSIS — O020 Blighted ovum and nonhydatidiform mole: Secondary | ICD-10-CM

## 2020-05-03 MED ORDER — IBGARD 90 MG PO CPCR: ORAL_CAPSULE | ORAL | Status: DC

## 2020-05-03 MED ORDER — SUTAB 1479-225-188 MG PO TABS: 1.0000 | ORAL_TABLET | Freq: Once | ORAL | 0 refills | Status: AC

## 2020-05-03 NOTE — Patient Instructions (Addendum)
If you are age 24 or older, your body mass index should be between 23-30. Your Body mass index is 22.14 kg/m. If this is out of the aforementioned range listed, please consider follow up with your Primary Care Provider.  If you are age 59 or younger, your body mass index should be between 19-25. Your Body mass index is 22.14 kg/m. If this is out of the aformentioned range listed, please consider follow up with your Primary Care Provider.   You have been scheduled for a colonoscopy. Please follow written instructions given to you at your visit today.  Please pick up your prep supplies at the pharmacy within the next 1-3 days. If you use inhalers (even only as needed), please bring them with you on the day of your procedure.  We are giving you a SUTAB sample today to use for your procedure.  Please go to the lab in the basement of our building to have lab work done as you leave today. Hit "B" for basement when you get on the elevator.  When the doors open the lab is on your left.  We will call you with the results. Thank you.  We are giving you a Low FOD-MAP diet today.  We have given you samples of the following medication to take: IBgard- Take as directed.  If this is helpful we are giving you a coupon and you can purchase more over the counter.  We will request your records from [facility].   Thank you for entrusting me with your care and for choosing [facility], Dr. Ileene Patrick

## 2020-05-03 NOTE — Progress Notes (Signed)
HPI :  25 year old female with a history of migraine headaches and chronic diarrhea, self-referred here for new patient evaluation for persistent chronic diarrhea and abdominal pain.  She states about 1 year ago she was in her usual state of health and ate what she thinks was some bad shrimp. She had onset of diarrhea and abdominal pain at that time. She states since then she has had persistent loose stools and abdominal pain which has not resolved and continued to bother her significantly. She averages about 5 bowel movements per day. She generally has a loose stool after she eats something but it can occur at any time. Occasionally she will have nocturnal stools. She feels very fatigued in the morning when she wakes up. She has significant lower abdominal cramping that bothers her. Sometimes she has no pain but sometimes she has severe cramping that can lead to severe discomfort. She went to the emergency room this past November and had a CT scan done on labs which did not show any acute pathology. She has been seeing some blood in her stool occasionally, does not sound like a significant amount. She does find some relief of her abdominal discomfort with a bowel movement for short period of time. She otherwise has a lot of gas and bloating that bothers her as well.  She does have some mild heartburn but does not take much for it, this does not really bother her too much. She generally eats well, no nausea or vomiting. No dysphagia. She states she has lost about 5 pounds in recent weeks due to her symptoms. She denies any chronic NSAID use. She denies any family history of Crohn's disease or colitis, or colon cancer. She does not drink any alcohol, does not use tobacco. She has never had a prior endoscopy or colonoscopy.  It sounds like she was seen by [facility] over the past year. She states she was given a variety of regimens to treat possible IBS but nothing has helped. Most recently she has been using  Bentyl which she states does not help at all. I inquired about other regimen she has been on previously but she cannot remember. It does not appear that she has been on rifaximin, Elavil, Colestid, etc. She has tried Imodium but states this makes her abdominal cramps worse so she does not take it. She does not think she has ever been tested for celiac disease or tried a low FODMAP diet. She is frustrated by her persistent symptoms and lack of improvement with regimen she has been tried on previously.  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  CT abdomen / pelvis 02/26/20 -  IMPRESSION: 1. No CT evidence for acute intra-abdominal or pelvic abnormality. 2. Small free fluid in the pelvis.   Past Medical History:  Diagnosis Date  . Chronic diarrhea   . Migraine headache      Past Surgical History:  Procedure Laterality Date  . DILATION AND EVACUATION N/A 09/23/2019   Procedure: DILATATION AND EVACUATION;  Surgeon: Adam Phenix, MD;  Location: [facility] OR;  Service: Gynecology;  Laterality: N/A;  . ETONOGESTREL IMPLANT SYSTEM  12/28/2014      . OPERATIVE ULTRASOUND N/A 09/23/2019   Procedure: OPERATIVE ULTRASOUND;  Surgeon: Adam Phenix, MD;  Location: [facility] OR;  Service: Gynecology;  Laterality: N/A;   History reviewed. No pertinent family history. NO family history of colon cancer, IBD Social History   Tobacco Use  . Smoking status: Never Smoker  . Smokeless  tobacco: Never Used  Vaping Use  . Vaping Use: Never used  Substance Use Topics  . Alcohol use: No  . Drug use: No   Current Outpatient Medications  Medication Sig Dispense Refill  . dicyclomine (BENTYL) 20 MG tablet Take 1 tablet (20 mg total) by mouth 2 (two) times daily as needed for spasms. 20 tablet 0  . ibuprofen (ADVIL) 600 MG tablet Take 1 tablet (600 mg total) by mouth every 6 (six) hours as needed. 30 tablet 1   No current facility-administered medications for this visit.   No Known Allergies   Review of Systems: All  systems reviewed and negative except where noted in HPI.   Lab Results  Component Value Date   WBC 6.8 02/26/2020   HGB 13.6 02/26/2020   HCT 42.5 02/26/2020   MCV 88.5 02/26/2020   PLT 274 02/26/2020    Lab Results  Component Value Date   CREATININE 0.73 02/26/2020   BUN 10 02/26/2020   NA 135 02/26/2020   K 4.2 02/26/2020   CL 104 02/26/2020   CO2 28 02/26/2020    Lab Results  Component Value Date   ALT 24 02/26/2020   AST 26 02/26/2020   ALKPHOS 47 02/26/2020   BILITOT 1.0 02/26/2020     Physical Exam: BP (!) 84/60   Pulse 71   Ht 5\' 4"  (1.626 m)   Wt 129 lb (58.5 kg)   LMP  (LMP Unknown)   BMI 22.14 kg/m  Constitutional: Pleasant,well-developed, female in no acute distress. HEENT: Normocephalic and atraumatic. Conjunctivae are normal. No scleral icterus. Neck supple.  Cardiovascular: Normal rate, regular rhythm.  Pulmonary/chest: Effort normal and breath sounds normal.  Abdominal: Soft, nondistended, nontender.  There are no masses palpable.  Extremities: no edema Lymphadenopathy: No cervical adenopathy noted. Neurological: Alert and oriented to person place and time. Skin: Skin is warm and dry. No rashes noted. Psychiatric: Normal mood and affect. Behavior is normal.   ASSESSMENT AND PLAN: 25 year old female here for new patient assessment the following:  Chronic diarrhea Rectal bleeding Abdominal pain GERD  History as outlined above. 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 Now with intermittent low-grade rectal bleeding. She had a CT scan in November which did not show any evidence of bowel wall thickening, no anemia, and that is reassuring. I discussed with her how aggressive she want to be with this evaluation right now given she does not have insurance. Given her worsening  symptoms, and rectal bleeding, optical colonoscopy would be best to ensure no evidence of IBD/colitis. I discussed colonoscopy and anesthesia with her, risks and benefits, as well as financial implications. She strongly wants to have a colonoscopy in light of her symptoms and understands these issues. In the interim I will reach out to [facility] to get the records of what she has tried in the past for this. I will also send her to the lab to screen for celiac disease. In the interim I will give her handout on low FODMAP diet and see if any dietary changes may help her. I also provided a free sample of IBgard to use for her abdominal pains and see if that will help, she can stop the dicyclomine as it is not providing benefit. For her loose stools I discussed a variety of options with her, she wants  to do a colonoscopy in the next few days so will await that result initially. We were able to find a free bowel preparation sample for her to use to help reduce cost. If the exam is negative, I would treat her for IBS /functional bowel disorder, consider trial of rifaximin versus Elavil, or empiric Colestid. We otherwise discussed her heartburn which sounds like this is a minor issue not causing her symptoms right now, she can use Pepcid as needed. She agreed with the plan, all questions answered, further recommendations pending results.  Ileene Patrick, MD [facility]

## 2020-05-04 ENCOUNTER — Other Ambulatory Visit: Payer: Self-pay | Admitting: Gastroenterology

## 2020-05-04 LAB — IGA: Immunoglobulin A: 185 mg/dL (ref 47–310)

## 2020-05-04 LAB — BETA HCG QUANT (REF LAB): hCG Quant: <1 m[IU]/mL

## 2020-05-04 LAB — TISSUE TRANSGLUTAMINASE, IGA: (tTG) Ab, IgA: <1 U/mL

## 2020-05-05 LAB — SARS CORONAVIRUS 2 (TAT 6-24 HRS): SARS Coronavirus 2: NEGATIVE

## 2020-05-07 ENCOUNTER — Telehealth: Payer: Self-pay

## 2020-05-07 NOTE — Telephone Encounter (Signed)
Left detailed message with negative results per DPR. Pt advised to return call or send mychart message with any questions or concerns.  Encounter closed

## 2020-05-07 NOTE — Telephone Encounter (Signed)
-----   Message from Adam Phenix, MD sent at 05/04/2020  1:50 PM EST ----- Negative result no need to repeat test

## 2020-05-08 ENCOUNTER — Other Ambulatory Visit: Payer: Self-pay

## 2020-05-08 ENCOUNTER — Encounter: Payer: Self-pay | Admitting: Gastroenterology

## 2020-05-08 ENCOUNTER — Ambulatory Visit (AMBULATORY_SURGERY_CENTER): Payer: Self-pay | Admitting: Gastroenterology

## 2020-05-08 VITALS — BP 105/51 | HR 59 | Temp 98.0°F | Resp 15 | Ht 64.0 in | Wt 129.0 lb

## 2020-05-08 DIAGNOSIS — K529 Noninfective gastroenteritis and colitis, unspecified: Secondary | ICD-10-CM

## 2020-05-08 DIAGNOSIS — R1030 Lower abdominal pain, unspecified: Secondary | ICD-10-CM

## 2020-05-08 DIAGNOSIS — K625 Hemorrhage of anus and rectum: Secondary | ICD-10-CM

## 2020-05-08 DIAGNOSIS — K648 Other hemorrhoids: Secondary | ICD-10-CM

## 2020-05-08 HISTORY — PX: COLONOSCOPY: SHX174

## 2020-05-08 MED ORDER — SODIUM CHLORIDE 0.9 % IV SOLN: 500.0000 mL | Freq: Once | INTRAVENOUS | Status: DC

## 2020-05-08 NOTE — Op Note (Signed)
[facility] Patient Name: Yolanda Harmon Procedure Date: 05/08/2020 10:54 AM MRN: [id number] Endoscopist: Viviann Spare P. Adela Lank , MD Age: 25 Referring MD:  Date of Birth: Oct 19, 1995 Gender: Female Account #: [id number] Procedure:                Colonoscopy Indications:              Lower abdominal pain, Chronic diarrhea, Rectal                            bleeding - has not responded to treatment of                            suspected IBS previously Medicines:                Monitored Anesthesia Care Procedure:                Pre-Anesthesia Assessment:                           - Prior to the procedure, a History and Physical                            was performed, and patient medications and                            allergies were reviewed. The patient's tolerance of                            previous anesthesia was also reviewed. The risks                            and benefits of the procedure and the sedation                            options and risks were discussed with the patient.                            All questions were answered, and informed consent                            was obtained. Prior Anticoagulants: The patient has                            taken no previous anticoagulant or antiplatelet                            agents. ASA Grade Assessment: II - A patient with                            mild systemic disease. After reviewing the risks                            and benefits, the patient was deemed in  satisfactory condition to undergo the procedure.                           After obtaining informed consent, the colonoscope                            was passed under direct vision. Throughout the                            procedure, the patient's blood pressure, pulse, and                            oxygen saturations were monitored continuously. The                            Olympus PFC-[id number] (#[id number])  Colonoscope was                            introduced through the anus and advanced to the the                            terminal ileum, with identification of the                            appendiceal orifice and IC valve. The colonoscopy                            was performed without difficulty. The patient                            tolerated the procedure well. The quality of the                            bowel preparation was adequate. The terminal ileum,                            ileocecal valve, appendiceal orifice, and rectum                            were photographed. Scope In: 11:02:10 AM Scope Out: 11:21:52 AM Scope Withdrawal Time: 0 hours 12 minutes 53 seconds  Total Procedure Duration: 0 hours 19 minutes 42 seconds  Findings:                 The perianal and digital rectal examinations were                            normal.                           The terminal ileum appeared normal.                           The colon was tortuous with multiple angulated  turns in the left colon.                           Internal hemorrhoids were found during                            retroflexion. The hemorrhoids were small.                           The exam was otherwise without abnormality. No                            overt inflammation noted.                           Biopsies for histology were taken with a cold                            forceps from the right colon, left colon and                            transverse colon for evaluation of microscopic                            colitis. Complications:            No immediate complications. Estimated blood loss:                            Minimal. Estimated Blood Loss:     Estimated blood loss was minimal. Impression:               - The examined portion of the ileum was normal.                           - Tortuous colon.                           - Internal hemorrhoids.                            - The examination was otherwise normal.                           - Biopsies were taken with a cold forceps from the                            right colon, left colon and transverse colon for                            evaluation of microscopic colitis.                           No evidence of Crohn's disease or colitis which is                            reassuring. Suspect  rectal bleeding due to small                            hemorrhoids in the setting of loose stools. Recommendation:           - Patient has a contact number available for                            emergencies. The signs and symptoms of potential                            delayed complications were discussed with the                            patient. Return to normal activities tomorrow.                            Written discharge instructions were provided to the                            patient.                           - Resume previous diet.                           - Continue present medications.                           - Continue with trial of low FODMAP diet, Bently,                            and IB gard as discussed in clinic                           - Await pathology results with further                            recommendations for treatment based on those results Viviann Spare P. Adela Lank, MD 05/08/2020 11:26:48 AM This report has been signed electronically.

## 2020-05-08 NOTE — Progress Notes (Signed)
Pt's states no medical or surgical changes since previsit or office visit.  ° °Vitals CW °

## 2020-05-08 NOTE — Progress Notes (Signed)
Called to room to assist during endoscopic procedure.  Patient ID and intended procedure confirmed with present staff. Received instructions for my participation in the procedure from the performing physician.  

## 2020-05-08 NOTE — Progress Notes (Signed)
pt tolerated well. VSS. awake and to recovery. Report given to RN.  

## 2020-05-08 NOTE — Patient Instructions (Signed)
YOU HAD AN ENDOSCOPIC PROCEDURE TODAY AT THE [facility]:   Refer to the procedure report that was given to you for any specific questions about what was found during the examination.  If the procedure report does not answer your questions, please call your gastroenterologist to clarify.  If you requested that your care partner not be given the details of your procedure findings, then the procedure report has been included in a sealed envelope for you to review at your convenience later.  YOU SHOULD EXPECT: Some feelings of bloating in the abdomen. Passage of more gas than usual.  Walking can help get rid of the air that was put into your GI tract during the procedure and reduce the bloating. If you had a lower endoscopy (such as a colonoscopy or flexible sigmoidoscopy) you may notice spotting of blood in your stool or on the toilet paper. If you underwent a bowel prep for your procedure, you may not have a normal bowel movement for a few days.  Please Note:  You might notice some irritation and congestion in your nose or some drainage.  This is from the oxygen used during your procedure.  There is no need for concern and it should clear up in a day or so.  SYMPTOMS TO REPORT IMMEDIATELY:   Following lower endoscopy (colonoscopy or flexible sigmoidoscopy):  Excessive amounts of blood in the stool  Significant tenderness or worsening of abdominal pains  Swelling of the abdomen that is new, acute  Fever of 100F or higher  For urgent or emergent issues, a gastroenterologist can be reached at any hour by calling [phone]. Do not use MyChart messaging for urgent concerns.    DIET:  We do recommend a small meal at first, but then you may proceed to your regular diet.  Drink plenty of fluids but you should avoid alcoholic beverages for 24 hours.  ACTIVITY:  You should plan to take it easy for the rest of today and you should NOT DRIVE or use heavy machinery until tomorrow (because  of the sedation medicines used during the test).    FOLLOW UP: Our staff will call the number listed on your records 48-72 hours following your procedure to check on you and address any questions or concerns that you may have regarding the information given to you following your procedure. If we do not reach you, we will leave a message.  We will attempt to reach you two times.  During this call, we will ask if you have developed any symptoms of COVID 19. If you develop any symptoms (ie: fever, flu-like symptoms, shortness of breath, cough etc.) before then, please call [phone].  If you test positive for Covid 19 in the 2 weeks post procedure, please call and report this information to us.    If any biopsies were taken you will be contacted by phone or by letter within the next 1-3 weeks.  Please call us at [phone] if you have not heard about the biopsies in 3 weeks.    SIGNATURES/CONFIDENTIALITY: You and/or your care partner have signed paperwork which will be entered into your electronic medical record.  These signatures attest to the fact that that the information above on your After Visit Summary has been reviewed and is understood.  Full responsibility of the confidentiality of this discharge information lies with you and/or your care-partner. 

## 2020-05-10 ENCOUNTER — Telehealth: Payer: Self-pay

## 2020-05-10 NOTE — Telephone Encounter (Signed)
  Follow up Call-  Call back number 05/08/2020  Post procedure Call Back phone  # [phone]  Permission to leave phone message Yes  Some recent data might be hidden     Patient questions:  Do you have a fever, pain , or abdominal swelling? No. Pain Score  4 *  Have you tolerated food without any problems? Yes.    Have you been able to return to your normal activities? Yes.    Do you have any questions about your discharge instructions: Diet   No. Medications  No. Follow up visit  No.  Do you have questions or concerns about your Care? No.  Actions: * If pain score is 4 or above: No action needed, pain <4.  Pt is still having the same lower left abdominal pain as she was having.  I advised pt to continue with the plan Dr. Adela Lank recommended 05-08-20 and await biopsy results.   1. Have you developed a fever since your procedure? no  2.   Have you had an respiratory symptoms (SOB or cough) since your procedure? no  3.   Have you tested positive for COVID 19 since your procedure no  4.   Have you had any family members/close contacts diagnosed with the COVID 19 since your procedure?  no   If yes to any of these questions please route to Laverna Peace, RN and Karlton Lemon, RN

## 2020-05-24 ENCOUNTER — Telehealth: Payer: Self-pay | Admitting: Gastroenterology

## 2020-05-24 ENCOUNTER — Telehealth: Payer: Self-pay

## 2020-05-24 NOTE — Telephone Encounter (Signed)
Records from prior GI evaluation at [facility] reviewed. For her symptoms looks like they tried pepto bismol, immodium. Hyocyamine. Align Tested for celiac disease and H pylori, treated for H pylori in 2020.   Would like to try her on Elavil, see other phone message and results note for details of recommendations.

## 2020-05-24 NOTE — Telephone Encounter (Signed)
LM for patient to call to discuss results and recommendations from colonoscopy.    From Pathology result note from Dr. Adela Lank:  Yolanda Harmon can you please contact the patient to relay the following: - Biopsies of her colon are normal I do not see any inflammation that is obvious - I suspect she probably has irritable bowel syndrome driving her symptoms and we had discussed this previously. - She should be on low FODMAP diet, Bentlyl, and IB gard PRN. Hopefully these measures have helped her -If not we can try some other options. Given her history of migraine headaches and symptoms I think she may be a good candidate for Elavil. I would like to try her on Elavil 25 mg nightly. Please counsel her this can make her drowsy, will titrate dose up slowly as she tolerates it. Please let her know this is a very old drug that was used for depression in the past but we are not using it for that purpose, we are using it to slow down her bowel in light of her diarrhea and also help her abdominal pain. I would like to have a follow-up visit with her in the clinic to reassess her in the next month or two and we can discuss other options if this does not help in the interim. Thanks

## 2020-05-28 NOTE — Telephone Encounter (Signed)
Okay great thanks [name]

## 2020-05-28 NOTE — Telephone Encounter (Signed)
Called and spoke to patient. Relayed results and recommendations. She indicates that the Low Fod-Map diet is helping and she is trying the IBgard. She will call us next week to give [place] an update and will let us know if wants to try the next step if she is not satisfied with her progress with the currently recommended measures.

## 2020-07-28 ENCOUNTER — Inpatient Hospital Stay (HOSPITAL_COMMUNITY)
Admission: AD | Admit: 2020-07-28 | Discharge: 2020-07-28 | Disposition: A | Discharge status: Home / Self Care | Payer: Self-pay | Attending: Obstetrics and Gynecology | Admitting: Obstetrics and Gynecology

## 2020-07-28 ENCOUNTER — Encounter (HOSPITAL_COMMUNITY): Payer: Self-pay | Admitting: Obstetrics and Gynecology

## 2020-07-28 ENCOUNTER — Inpatient Hospital Stay (HOSPITAL_COMMUNITY): Payer: Self-pay

## 2020-07-28 ENCOUNTER — Other Ambulatory Visit: Payer: Self-pay

## 2020-07-28 DIAGNOSIS — Z3490 Encounter for supervision of normal pregnancy, unspecified, unspecified trimester: Secondary | ICD-10-CM

## 2020-07-28 DIAGNOSIS — O3680X0 Pregnancy with inconclusive fetal viability, not applicable or unspecified: Secondary | ICD-10-CM

## 2020-07-28 DIAGNOSIS — R109 Unspecified abdominal pain: Secondary | ICD-10-CM | POA: Insufficient documentation

## 2020-07-28 DIAGNOSIS — O26891 Other specified pregnancy related conditions, first trimester: Secondary | ICD-10-CM

## 2020-07-28 DIAGNOSIS — R300 Dysuria: Secondary | ICD-10-CM | POA: Insufficient documentation

## 2020-07-28 DIAGNOSIS — Z3A09 9 weeks gestation of pregnancy: Secondary | ICD-10-CM | POA: Insufficient documentation

## 2020-07-28 DIAGNOSIS — O99891 Other specified diseases and conditions complicating pregnancy: Secondary | ICD-10-CM | POA: Insufficient documentation

## 2020-07-28 DIAGNOSIS — R102 Pelvic and perineal pain: Secondary | ICD-10-CM | POA: Insufficient documentation

## 2020-07-28 LAB — WET PREP, GENITAL
Sperm: NONE SEEN
Trich, Wet Prep: NONE SEEN
WBC, Wet Prep HPF POC: NONE SEEN
Yeast Wet Prep HPF POC: NONE SEEN

## 2020-07-28 LAB — CBC
HCT: 38 % (ref 36.0–46.0)
Hemoglobin: 12.7 g/dL (ref 12.0–15.0)
MCH: 29.7 pg (ref 26.0–34.0)
MCHC: 33.4 g/dL (ref 30.0–36.0)
MCV: 89 fL (ref 80.0–100.0)
Platelets: 264 10*3/uL (ref 150–400)
RBC: 4.27 MIL/uL (ref 3.87–5.11)
RDW: 13 % (ref 11.5–15.5)
WBC: 10.6 10*3/uL — ABNORMAL HIGH (ref 4.0–10.5)
nRBC: 0 % (ref 0.0–0.2)

## 2020-07-28 LAB — URINALYSIS, ROUTINE W REFLEX MICROSCOPIC
Bilirubin Urine: NEGATIVE
Glucose, UA: NEGATIVE mg/dL
Hgb urine dipstick: NEGATIVE
Ketones, ur: NEGATIVE mg/dL
Leukocytes,Ua: NEGATIVE
Nitrite: NEGATIVE
Protein, ur: NEGATIVE mg/dL
Specific Gravity, Urine: 1.024 (ref 1.005–1.030)
pH: 5 (ref 5.0–8.0)

## 2020-07-28 LAB — POCT PREGNANCY, URINE
Preg Test, Ur: POSITIVE — AB
Preg Test, Ur: POSITIVE — AB

## 2020-07-28 LAB — HCG, QUANTITATIVE, PREGNANCY: hCG, Beta Chain, Quant, S: 247434 m[IU]/mL — ABNORMAL HIGH (ref <5)

## 2020-07-28 LAB — HIV ANTIBODY (ROUTINE TESTING W REFLEX): HIV Screen 4th Generation wRfx: NONREACTIVE

## 2020-07-28 LAB — OB RESULTS CONSOLE GC/CHLAMYDIA: Gonorrhea: NEGATIVE

## 2020-07-28 NOTE — MAU Provider Note (Signed)
Patient Yolanda Harmon is a 25 y.o. G3P1011  at [redacted]w[redacted]d here with complaints of abdominal pain that started this morning at 10 am. The got worse during the day. She denies vaginal bleeding, vaginal discharge, vomiting, diarrhea, constipation, fever, SOB. She endorses some dysuria but thinks it is not a UTI, it is more of pregnancy discomfort.   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  She was advised not to get pregnant for 18 months, however, she could not afford her birth control pills. Last beta HCG was in January 2022 and was less than 1.  History     CSN: [id number]  Arrival date and time: 07/28/20 1831   Event Date/Time   First Provider Initiated Contact with Patient 07/28/20 2053      Chief Complaint  Patient presents with  . Abdominal Pain  . Dysuria   Abdominal Pain This is a new problem. The current episode started today. The problem occurs intermittently. The pain is located in the suprapubic region. The pain is at a severity of 5/10. The quality of the pain is cramping. The abdominal pain does not radiate. Associated symptoms include dysuria. Pertinent negatives include no constipation, diarrhea, fever, nausea or vomiting. The pain is aggravated by movement. The pain is relieved by nothing.  Dysuria  Pertinent negatives include no nausea or vomiting.    OB History    Gravida  3   Para  1   Term  1   Preterm      AB  1   Living  1     SAB      IAB      Ectopic      Multiple  0   Live Births  1           Past Medical History:  Diagnosis Date  . Chronic diarrhea   . Migraine headache   . 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 COLONOSCOPY  05/08/2020  . DILATION AND EVACUATION N/A 09/23/2019   Procedure: DILATATION AND EVACUATION;  Surgeon: Adam Phenix, MD;  Location: [facility] OR;  Service: Gynecology;  Laterality: N/A;  . ETONOGESTREL IMPLANT SYSTEM  12/28/2014      .  OPERATIVE ULTRASOUND N/A 09/23/2019   Procedure: OPERATIVE ULTRASOUND;  Surgeon: Adam Phenix, MD;  Location: [facility] OR;  Service: Gynecology;  Laterality: N/A;    History reviewed. No pertinent family history.  Social History   Tobacco Use  . Smoking status: Never Smoker  . Smokeless tobacco: Never Used  Vaping Use  . Vaping Use: Never used  Substance Use Topics  . Alcohol use: No  . Drug use: No    Allergies: No Known Allergies  Medications Prior to Admission  Medication Sig Dispense Refill Last Dose  . acetaminophen (TYLENOL) 500 MG tablet Take 500 mg by mouth every 6 (six) hours as needed.   07/28/2020 at Unknown time  . dicyclomine (BENTYL) 20 MG tablet Take 1 tablet (20 mg total) by mouth 2 (two) times daily as needed for spasms. 20 tablet 0   . ibuprofen (ADVIL) 600 MG tablet Take 1 tablet (600 mg total) by mouth every 6 (six) hours as needed. 30 tablet 1   . Peppermint Oil (IBGARD) 90 MG CPCR Use as directed       Review of Systems  Constitutional: Negative.  Negative for fever.  HENT: Negative.   Respiratory: Negative.  Gastrointestinal: Positive for abdominal pain. Negative for constipation, diarrhea, nausea and vomiting.  Genitourinary: Positive for dysuria.  Musculoskeletal: Negative.   Neurological: Negative.   Hematological: Negative.   Psychiatric/Behavioral: Negative.    Physical Exam   Blood pressure 108/64, pulse 65, temperature 98.2 F (36.8 C), temperature source Oral, resp. rate 15, height 5\' 4"  (1.626 m), weight 58.1 kg, last menstrual period 05/01/2020, SpO2 100 %, unknown if currently breastfeeding.  Physical Exam Constitutional:      Appearance: She is well-developed.  Cardiovascular:     Rate and Rhythm: Normal rate.     Heart sounds: Normal heart sounds.  Pulmonary:     Effort: Pulmonary effort is normal.  Abdominal:     General: Abdomen is flat. Bowel sounds are normal.     Palpations: Abdomen is soft.     Tenderness: There is no  abdominal tenderness.  Genitourinary:    Vagina: Normal.  Neurological:     Mental Status: She is alert.   Bimanual exam deferred  MAU Course  Procedures  MDM -will do ectopic rule out -reviewed patient's last BHCG, which was less than 1 on 05/03/2020.   -labs ordered, 05/05/2020 ordered.  -US shows SIUP with present cardiac acitivyt; I have independently reviewed the [place] images, which reveal finding of SIUP with cardiac activity -bHCG today is [phone] -patient has clue cells but does not meet criteria for BV (no abnormal discharge, odor, discomfort) -bimanuel deferred Assessment and Plan   1. Intrauterine pregnancy   2. Pregnancy of unknown anatomic location   3. Pelvic pain affecting pregnancy in first trimester, antepartum   -Reviewed reassuring [phone] report with patient, patient is happy about [place] -1st trimester precautions reviewed -Patient has NOB appt at [facility] on 4/29; she plans to keep that appt -GC CT is pending -will send urine for culture  5/29 Yolanda Harmon 07/28/2020, 8:58 PM

## 2020-07-28 NOTE — MAU Note (Signed)
Pt reports severe lower abd pain that started this am. Burning with urination. Pt reports she is [redacted] weeks pregnant.

## 2020-07-30 LAB — CULTURE, OB URINE

## 2020-07-30 LAB — GC/CHLAMYDIA PROBE AMP (~~LOC~~) NOT AT ARMC
Chlamydia: NEGATIVE
Comment: NEGATIVE
Comment: NORMAL
Neisseria Gonorrhea: NEGATIVE

## 2020-09-04 LAB — OB RESULTS CONSOLE HEPATITIS B SURFACE ANTIGEN: Hepatitis B Surface Ag: NEGATIVE

## 2020-09-04 LAB — HEPATITIS C ANTIBODY: HCV Ab: NEGATIVE

## 2020-09-04 LAB — OB RESULTS CONSOLE GC/CHLAMYDIA
Chlamydia: NEGATIVE
Gonorrhea: NEGATIVE

## 2020-09-04 LAB — OB RESULTS CONSOLE RUBELLA ANTIBODY, IGM: Rubella: IMMUNE

## 2020-09-05 ENCOUNTER — Telehealth: Payer: Self-pay

## 2020-09-05 NOTE — Telephone Encounter (Signed)
Mailed patient her GFE with a new patient letter and map.  

## 2020-09-10 ENCOUNTER — Other Ambulatory Visit: Payer: Self-pay | Admitting: Family

## 2020-09-10 DIAGNOSIS — Z3A18 18 weeks gestation of pregnancy: Secondary | ICD-10-CM

## 2020-09-10 DIAGNOSIS — Z8759 Personal history of other complications of pregnancy, childbirth and the puerperium: Secondary | ICD-10-CM

## 2020-09-10 DIAGNOSIS — Z141 Cystic fibrosis carrier: Secondary | ICD-10-CM

## 2020-09-10 DIAGNOSIS — Z363 Encounter for antenatal screening for malformations: Secondary | ICD-10-CM

## 2020-09-26 ENCOUNTER — Other Ambulatory Visit: Payer: Self-pay

## 2020-09-26 ENCOUNTER — Ambulatory Visit: Payer: Self-pay | Attending: Family

## 2020-09-26 ENCOUNTER — Other Ambulatory Visit: Payer: Self-pay | Admitting: *Deleted

## 2020-09-26 ENCOUNTER — Ambulatory Visit: Payer: Self-pay | Admitting: *Deleted

## 2020-09-26 ENCOUNTER — Ambulatory Visit: Payer: Self-pay

## 2020-09-26 ENCOUNTER — Encounter: Payer: Self-pay | Admitting: *Deleted

## 2020-09-26 VITALS — BP 98/56 | HR 67

## 2020-09-26 DIAGNOSIS — Z3A18 18 weeks gestation of pregnancy: Secondary | ICD-10-CM | POA: Insufficient documentation

## 2020-09-26 DIAGNOSIS — O4400 Placenta previa specified as without hemorrhage, unspecified trimester: Secondary | ICD-10-CM

## 2020-09-26 DIAGNOSIS — Z3689 Encounter for other specified antenatal screening: Secondary | ICD-10-CM | POA: Insufficient documentation

## 2020-09-26 DIAGNOSIS — Z363 Encounter for antenatal screening for malformations: Secondary | ICD-10-CM | POA: Insufficient documentation

## 2020-09-26 DIAGNOSIS — Z141 Cystic fibrosis carrier: Secondary | ICD-10-CM | POA: Insufficient documentation

## 2020-09-26 DIAGNOSIS — Z8759 Personal history of other complications of pregnancy, childbirth and the puerperium: Secondary | ICD-10-CM | POA: Insufficient documentation

## 2020-11-21 ENCOUNTER — Other Ambulatory Visit: Payer: Self-pay

## 2020-11-21 ENCOUNTER — Ambulatory Visit: Payer: Self-pay | Admitting: *Deleted

## 2020-11-21 ENCOUNTER — Ambulatory Visit: Payer: Self-pay | Attending: Obstetrics and Gynecology

## 2020-11-21 ENCOUNTER — Other Ambulatory Visit: Payer: Self-pay | Admitting: *Deleted

## 2020-11-21 ENCOUNTER — Other Ambulatory Visit: Payer: Self-pay | Admitting: Obstetrics and Gynecology

## 2020-11-21 VITALS — BP 95/52 | HR 68

## 2020-11-21 DIAGNOSIS — Z141 Cystic fibrosis carrier: Secondary | ICD-10-CM

## 2020-11-21 DIAGNOSIS — O4400 Placenta previa specified as without hemorrhage, unspecified trimester: Secondary | ICD-10-CM

## 2020-11-21 DIAGNOSIS — O4402 Placenta previa specified as without hemorrhage, second trimester: Secondary | ICD-10-CM

## 2020-11-21 DIAGNOSIS — O4440 Low lying placenta NOS or without hemorrhage, unspecified trimester: Secondary | ICD-10-CM

## 2020-11-21 DIAGNOSIS — Z3A26 26 weeks gestation of pregnancy: Secondary | ICD-10-CM

## 2020-11-21 DIAGNOSIS — O09292 Supervision of pregnancy with other poor reproductive or obstetric history, second trimester: Secondary | ICD-10-CM

## 2020-11-21 DIAGNOSIS — Z363 Encounter for antenatal screening for malformations: Secondary | ICD-10-CM

## 2020-12-11 LAB — OB RESULTS CONSOLE RPR: RPR: NONREACTIVE

## 2021-01-02 ENCOUNTER — Other Ambulatory Visit: Payer: Self-pay

## 2021-01-02 ENCOUNTER — Ambulatory Visit: Payer: Self-pay | Attending: Maternal & Fetal Medicine

## 2021-01-02 ENCOUNTER — Ambulatory Visit: Payer: Self-pay | Admitting: *Deleted

## 2021-01-02 VITALS — BP 108/58 | HR 80

## 2021-01-02 DIAGNOSIS — O09893 Supervision of other high risk pregnancies, third trimester: Secondary | ICD-10-CM

## 2021-01-02 DIAGNOSIS — O4403 Placenta previa specified as without hemorrhage, third trimester: Secondary | ICD-10-CM

## 2021-01-02 DIAGNOSIS — O4443 Low lying placenta NOS or without hemorrhage, third trimester: Secondary | ICD-10-CM

## 2021-01-02 DIAGNOSIS — O4440 Low lying placenta NOS or without hemorrhage, unspecified trimester: Secondary | ICD-10-CM | POA: Insufficient documentation

## 2021-01-02 DIAGNOSIS — O09293 Supervision of pregnancy with other poor reproductive or obstetric history, third trimester: Secondary | ICD-10-CM

## 2021-01-02 DIAGNOSIS — Z3A32 32 weeks gestation of pregnancy: Secondary | ICD-10-CM

## 2021-01-16 NOTE — Progress Notes (Deleted)
Cardiology Office Note:    Date:  01/16/2021   ID:  Yolanda Harmon, DOB 22-Dec-1995, MRN [id number]  PCP:  Patient, No Pcp Per (Inactive)  Cardiologist:  None  Electrophysiologist:  None   Referring MD: Braymer Bing, MD   No chief complaint on file. ***  History of Present Illness:    Yolanda Harmon is a 25 y.o. female with a hx of ***who is referred by Dr. Vergie Living for shortness of breath.  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    Past Surgical History:  Procedure Laterality Date   COLONOSCOPY  05/08/2020   DILATION AND EVACUATION N/A 09/23/2019   Procedure: DILATATION AND EVACUATION;  Surgeon: Adam Phenix, MD;  Location: [facility] OR;  Service: Gynecology;  Laterality: N/A;   ETONOGESTREL IMPLANT SYSTEM  12/28/2014       OPERATIVE ULTRASOUND N/A 09/23/2019   Procedure: OPERATIVE ULTRASOUND;  Surgeon: Adam Phenix, MD;  Location: [facility] OR;  Service: Gynecology;  Laterality: N/A;    Current Medications: No outpatient medications have been marked as taking for the 01/17/21 encounter (Appointment) with Little Ishikawa, MD.     Allergies:   Patient has no known allergies.   Social History   Socioeconomic History   Marital status: Significant Other    Spouse name: Not on file   Number of children: Not on file   Years of education: Not on file   Highest education level: Not on file  Occupational History   Not on file  Tobacco Use   Smoking status: Never   Smokeless tobacco: Never  Vaping Use   Vaping Use: Never used  Substance and Sexual Activity   Alcohol use: No   Drug use: No   Sexual activity: Yes  Other Topics Concern   Not on file  Social History Narrative   Not on file   Social Determinants of Health   Financial Resource Strain: Not on file  Food Insecurity: Not on file  Transportation Needs: Not on file  Physical Activity: Not on file  Stress: Not on file   Social Connections: Not on file     Family History: The patient's ***family history includes Diabetes in her maternal aunt, maternal grandfather, maternal uncle, and mother; Hypertension in her mother.  ROS:   Please see the history of present illness.    *** All other systems reviewed and are negative.  EKGs/Labs/Other Studies Reviewed:    The following studies were reviewed today: ***  EKG:  EKG is *** ordered today.  The ekg ordered today demonstrates ***  Recent Labs: 02/26/2020: ALT 24; BUN 10; Creatinine, Ser 0.73; Potassium 4.2; Sodium 135 07/28/2020: Hemoglobin 12.7; Platelets 264  Recent Lipid Panel No results found for: CHOL, TRIG, HDL, CHOLHDL, VLDL, LDLCALC, LDLDIRECT  Physical Exam:    VS:  LMP 05/01/2020 (Approximate)     Wt Readings from Last 3 Encounters:  07/28/20 128 lb (58.1 kg)  05/08/20 129 lb (58.5 kg)  05/03/20 129 lb (58.5 kg)     GEN: *** Well nourished, well developed in no acute distress HEENT: Normal NECK: No JVD; No carotid bruits LYMPHATICS: No lymphadenopathy CARDIAC: ***RRR, no murmurs, rubs, gallops RESPIRATORY:  Clear to auscultation without rales, wheezing or rhonchi  ABDOMEN: Soft, non-tender, non-distended MUSCULOSKELETAL:  No edema; No deformity  SKIN: Warm and dry NEUROLOGIC:  Alert and oriented x 3 PSYCHIATRIC:  Normal affect   ASSESSMENT:  No diagnosis found. PLAN:    In order of problems listed above:  ***   Medication Adjustments/Labs and Tests Ordered: Current medicines are reviewed at length with the patient today.  Concerns regarding medicines are outlined above.  No orders of the defined types were placed in this encounter.  No orders of the defined types were placed in this encounter.   There are no Patient Instructions on file for this visit.   Signed, Little Ishikawa, MD  01/16/2021 11:06 PM    [facility]

## 2021-01-17 ENCOUNTER — Ambulatory Visit: Payer: Self-pay | Admitting: Cardiology

## 2021-01-19 ENCOUNTER — Inpatient Hospital Stay (HOSPITAL_COMMUNITY)
Admission: AD | Admit: 2021-01-19 | Discharge: 2021-01-20 | Disposition: A | Discharge status: Home / Self Care | Payer: Self-pay | Attending: Obstetrics & Gynecology | Admitting: Obstetrics & Gynecology

## 2021-01-19 ENCOUNTER — Other Ambulatory Visit: Payer: Self-pay

## 2021-01-19 DIAGNOSIS — Z3A35 35 weeks gestation of pregnancy: Secondary | ICD-10-CM | POA: Insufficient documentation

## 2021-01-19 DIAGNOSIS — Z3A00 Weeks of gestation of pregnancy not specified: Secondary | ICD-10-CM

## 2021-01-19 DIAGNOSIS — R6884 Jaw pain: Secondary | ICD-10-CM | POA: Insufficient documentation

## 2021-01-19 DIAGNOSIS — O26893 Other specified pregnancy related conditions, third trimester: Secondary | ICD-10-CM | POA: Insufficient documentation

## 2021-01-19 DIAGNOSIS — O99891 Other specified diseases and conditions complicating pregnancy: Secondary | ICD-10-CM

## 2021-01-19 DIAGNOSIS — R040 Epistaxis: Secondary | ICD-10-CM | POA: Insufficient documentation

## 2021-01-19 DIAGNOSIS — K029 Dental caries, unspecified: Secondary | ICD-10-CM | POA: Insufficient documentation

## 2021-01-19 DIAGNOSIS — Z3A34 34 weeks gestation of pregnancy: Secondary | ICD-10-CM

## 2021-01-19 NOTE — MAU Provider Note (Signed)
Event Date/Time   First Provider Initiated Contact with Patient 01/19/21 2351      S Ms. Yolanda Harmon is a 25 y.o. G3P1011 patient who presents to MAU today with complaint of jaw pain.  She states she has been having lower jaw pain for 2 days and was seen at the dentist 3 days ago.  She reports she was told she has 2 cavities, but they can't fill them until after the pregnancy.  However, she is not sure why.  She describes the pain as throbbing and rates it a 9.5/10.  She states it is constant and is not relieved with tylenol.    Patient endorses fetal movement and denies vaginal bleeding, discharge, or leaking.   Patient also reports an incident of epistaxis "a few nights ago" and reports she had "a lot of bleeding."  She states that afterwards she was experiencing SOB and difficulty breathing.  She denies current issues with SOB and had "a couple more" incidents of epistaxis, with most recent incident yesterday.   O BP 101/64   Pulse 92   Temp 98.8 F (37.1 C) (Oral)   Resp 18   Ht 5\' 4"  (1.626 m)   Wt 65.3 kg   LMP 05/01/2020 (Approximate)   BMI 24.72 kg/m  Physical Exam Vitals reviewed.  Constitutional:      General: She is not in acute distress.    Appearance: She is well-developed.  HENT:     Head: Normocephalic and atraumatic.     Mouth/Throat:     Lips: No lesions.     Mouth: Mucous membranes are dry. No oral lesions.     Dentition: Dental caries (Bilateral back molars) present.  Eyes:     Conjunctiva/sclera: Conjunctivae normal.  Cardiovascular:     Rate and Rhythm: Normal rate and regular rhythm.  Pulmonary:     Effort: Pulmonary effort is normal.     Breath sounds: Normal breath sounds. No decreased breath sounds.  Abdominal:     Tenderness: There is no abdominal tenderness.     Comments: Gravid, appears AGA  Musculoskeletal:     Cervical back: Normal range of motion.  Skin:    General: Skin is warm and dry.  Neurological:     Mental Status: She is  alert and oriented to person, place, and time.  Psychiatric:        Mood and Affect: Mood normal.        Behavior: Behavior normal.   FHR 135 by doppler  A Medical screening exam complete Jaw Pain Epistaxis   P -Informed that it is okay to receive dental work while pregnant.  Will give dental treatment note with parameters. -Reassured of normalcy of nosebleeds during pregnancy.  Reviewed how to manage. -Informed that SOB likely d/t panic of nosebleed and tilting head back.  Encouraged to tilt head forward to not block airway with blood.  -Instructed to take tylenol OTC until able to complete dental work. -Informed that no need for antibiotics as no visual signs of infection/abscess. -Patient verbalizes understanding. -No questions.  -Discharge from MAU in stable condition -Warning signs for worsening condition that would warrant emergency follow-up discussed -Patient may return to MAU as needed   05/03/2020, CNM 01/19/2021 11:51 PM

## 2021-01-19 NOTE — MAU Note (Addendum)
Pt here c/o of nose bleed, sob and "really bad left jaw pain, which is giving me headache and bad fevers when I sleep"  started 2 days ago. Pt thinks its a dental issue.  No pregnancy related c/o

## 2021-01-20 NOTE — Progress Notes (Signed)
Pt evaluated in triage room by Gerrit Heck, cnm and cleared to go see a dentist. Dental note given.

## 2021-01-31 LAB — OB RESULTS CONSOLE GBS: GBS: POSITIVE

## 2021-02-27 ENCOUNTER — Encounter (HOSPITAL_COMMUNITY): Payer: Self-pay | Admitting: Anesthesiology

## 2021-02-27 ENCOUNTER — Other Ambulatory Visit: Payer: Self-pay

## 2021-02-27 ENCOUNTER — Inpatient Hospital Stay (HOSPITAL_COMMUNITY)
Admission: AD | Admit: 2021-02-27 | Discharge: 2021-03-01 | DRG: 768 | Disposition: A | Discharge status: Home / Self Care | Payer: Medicaid Other | Attending: Obstetrics & Gynecology | Admitting: Obstetrics & Gynecology

## 2021-02-27 ENCOUNTER — Encounter (HOSPITAL_COMMUNITY): Payer: Self-pay | Admitting: Obstetrics & Gynecology

## 2021-02-27 DIAGNOSIS — Z3A40 40 weeks gestation of pregnancy: Secondary | ICD-10-CM

## 2021-02-27 DIAGNOSIS — O9081 Anemia of the puerperium: Secondary | ICD-10-CM | POA: Diagnosis not present

## 2021-02-27 DIAGNOSIS — Z20822 Contact with and (suspected) exposure to covid-19: Secondary | ICD-10-CM | POA: Diagnosis present

## 2021-02-27 DIAGNOSIS — D62 Acute posthemorrhagic anemia: Secondary | ICD-10-CM | POA: Diagnosis not present

## 2021-02-27 DIAGNOSIS — O480 Post-term pregnancy: Secondary | ICD-10-CM

## 2021-02-27 DIAGNOSIS — O99824 Streptococcus B carrier state complicating childbirth: Principal | ICD-10-CM | POA: Diagnosis present

## 2021-02-27 DIAGNOSIS — O26893 Other specified pregnancy related conditions, third trimester: Secondary | ICD-10-CM | POA: Diagnosis present

## 2021-02-27 LAB — CBC
HCT: 30.1 % — ABNORMAL LOW (ref 36.0–46.0)
HCT: 27.6 % — ABNORMAL LOW (ref 36.0–46.0)
Hemoglobin: 9.7 g/dL — ABNORMAL LOW (ref 12.0–15.0)
Hemoglobin: 8.8 g/dL — ABNORMAL LOW (ref 12.0–15.0)
MCH: 26.9 pg (ref 26.0–34.0)
MCH: 26.9 pg (ref 26.0–34.0)
MCHC: 32.2 g/dL (ref 30.0–36.0)
MCHC: 31.9 g/dL (ref 30.0–36.0)
MCV: 83.6 fL (ref 80.0–100.0)
MCV: 84.4 fL (ref 80.0–100.0)
Platelets: 179 10*3/uL (ref 150–400)
Platelets: 174 10*3/uL (ref 150–400)
RBC: 3.6 MIL/uL — ABNORMAL LOW (ref 3.87–5.11)
RBC: 3.27 MIL/uL — ABNORMAL LOW (ref 3.87–5.11)
RDW: 12.9 % (ref 11.5–15.5)
RDW: 13 % (ref 11.5–15.5)
WBC: 5.5 10*3/uL (ref 4.0–10.5)
WBC: 7.1 10*3/uL (ref 4.0–10.5)
nRBC: 0 % (ref 0.0–0.2)
nRBC: 0 % (ref 0.0–0.2)

## 2021-02-27 LAB — RESP PANEL BY RT-PCR (FLU A&B, COVID) ARPGX2
Influenza A by PCR: NEGATIVE
Influenza B by PCR: NEGATIVE
SARS Coronavirus 2 by RT PCR: NEGATIVE

## 2021-02-27 MED ORDER — OXYCODONE-ACETAMINOPHEN 5-325 MG PO TABS: 1.0000 | ORAL_TABLET | ORAL | Status: DC | PRN

## 2021-02-27 MED ORDER — LACTATED RINGERS IV SOLN: INTRAVENOUS | Status: DC

## 2021-02-27 MED ORDER — LACTATED RINGERS IV SOLN
500.0000 mL | INTRAVENOUS | Status: DC | PRN
  Administered 2021-02-27: 1000 mL via INTRAVENOUS

## 2021-02-27 MED ORDER — LACTATED RINGERS IV SOLN
500.0000 mL | Freq: Once | INTRAVENOUS | Status: AC
  Administered 2021-02-27: 500 mL via INTRAVENOUS

## 2021-02-27 MED ORDER — LIDOCAINE HCL (PF) 1 % IJ SOLN: 30.0000 mL | INTRAMUSCULAR | Status: DC | PRN

## 2021-02-27 MED ORDER — PHENYLEPHRINE 40 MCG/ML (10ML) SYRINGE FOR IV PUSH (FOR BLOOD PRESSURE SUPPORT): 80.0000 ug | PREFILLED_SYRINGE | INTRAVENOUS | Status: DC | PRN

## 2021-02-27 MED ORDER — FENTANYL-BUPIVACAINE-NACL 0.5-0.125-0.9 MG/250ML-% EP SOLN
12.0000 mL/h | EPIDURAL | Status: DC | PRN
  Filled 2021-02-27: qty 250

## 2021-02-27 MED ORDER — ACETAMINOPHEN 325 MG PO TABS: 650.0000 mg | ORAL_TABLET | ORAL | Status: DC | PRN

## 2021-02-27 MED ORDER — OXYCODONE-ACETAMINOPHEN 5-325 MG PO TABS: 2.0000 | ORAL_TABLET | ORAL | Status: DC | PRN

## 2021-02-27 MED ORDER — METHYLERGONOVINE MALEATE 0.2 MG/ML IJ SOLN
INTRAMUSCULAR | Status: AC
  Administered 2021-02-27: 0.2 mg
  Filled 2021-02-27: qty 1

## 2021-02-27 MED ORDER — LACTATED RINGERS IV BOLUS
1000.0000 mL | Freq: Once | INTRAVENOUS | Status: AC
  Administered 2021-02-27: 1000 mL via INTRAVENOUS

## 2021-02-27 MED ORDER — TRANEXAMIC ACID-NACL 1000-0.7 MG/100ML-% IV SOLN: 1000.0000 mg | Freq: Once | INTRAVENOUS | Status: AC

## 2021-02-27 MED ORDER — CEFAZOLIN SODIUM-DEXTROSE 2-4 GM/100ML-% IV SOLN
2.0000 g | Freq: Three times a day (TID) | INTRAVENOUS | Status: AC
  Administered 2021-02-27 – 2021-02-28 (×3): 2 g via INTRAVENOUS
  Filled 2021-02-27 (×5): qty 100

## 2021-02-27 MED ORDER — TRANEXAMIC ACID-NACL 1000-0.7 MG/100ML-% IV SOLN
INTRAVENOUS | Status: AC
  Administered 2021-02-27: 1000 mg
  Filled 2021-02-27: qty 100

## 2021-02-27 MED ORDER — FENTANYL CITRATE (PF) 100 MCG/2ML IJ SOLN
50.0000 ug | INTRAMUSCULAR | Status: DC | PRN
  Administered 2021-02-27: 100 ug via INTRAVENOUS
  Filled 2021-02-27: qty 2

## 2021-02-27 MED ORDER — OXYTOCIN BOLUS FROM INFUSION
333.0000 mL | Freq: Once | INTRAVENOUS | Status: AC
  Administered 2021-02-27: 333 mL via INTRAVENOUS

## 2021-02-27 MED ORDER — SOD CITRATE-CITRIC ACID 500-334 MG/5ML PO SOLN: 30.0000 mL | ORAL | Status: DC | PRN

## 2021-02-27 MED ORDER — DIPHENHYDRAMINE HCL 50 MG/ML IJ SOLN: 12.5000 mg | INTRAMUSCULAR | Status: DC | PRN

## 2021-02-27 MED ORDER — PENICILLIN G POT IN DEXTROSE 60000 UNIT/ML IV SOLN: 3.0000 10*6.[IU] | INTRAVENOUS | Status: DC

## 2021-02-27 MED ORDER — EPHEDRINE 5 MG/ML INJ: 10.0000 mg | INTRAVENOUS | Status: DC | PRN

## 2021-02-27 MED ORDER — OXYTOCIN-SODIUM CHLORIDE 30-0.9 UT/500ML-% IV SOLN
2.5000 [IU]/h | INTRAVENOUS | Status: DC
  Administered 2021-02-27: 2.5 [IU]/h via INTRAVENOUS
  Filled 2021-02-27: qty 500

## 2021-02-27 MED ORDER — SODIUM CHLORIDE 0.9 % IV SOLN
5.0000 10*6.[IU] | Freq: Once | INTRAVENOUS | Status: AC
  Administered 2021-02-27: 5 10*6.[IU] via INTRAVENOUS
  Filled 2021-02-27: qty 5

## 2021-02-27 MED ORDER — METHYLERGONOVINE MALEATE 0.2 MG/ML IJ SOLN: 0.2000 mg | Freq: Once | INTRAMUSCULAR | Status: AC

## 2021-02-27 MED ORDER — ONDANSETRON HCL 4 MG/2ML IJ SOLN: 4.0000 mg | Freq: Four times a day (QID) | INTRAMUSCULAR | Status: DC | PRN

## 2021-02-27 NOTE — H&P (Signed)
OBSTETRIC ADMISSION HISTORY AND PHYSICAL  Yolanda Harmon is a 25 y.o. female G3P1011 with IUP at [redacted]w[redacted]d by early [place] presenting for active labor. Now feeling more painful contractions.  She reports +FMs, No LOF, no VB, no blurry vision, headaches or peripheral edema, and RUQ pain.  She plans on breast feeding. She request IUD for birth control as an outpatient. 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    Past Surgical History: Past Surgical History:  Procedure Laterality Date   COLONOSCOPY  05/08/2020   DILATION AND EVACUATION N/A 09/23/2019   Procedure: DILATATION AND EVACUATION;  Surgeon: 09/25/2019, MD;  Location: [facility] OR;  Service: Gynecology;  Laterality: N/A;   ETONOGESTREL IMPLANT SYSTEM  12/28/2014       OPERATIVE ULTRASOUND N/A 09/23/2019   Procedure: OPERATIVE ULTRASOUND;  Surgeon: 09/25/2019, MD;  Location: [facility] OR;  Service: Gynecology;  Laterality: N/A;    Obstetrical History: OB History     Gravida  3   Para  1   Term  1   Preterm      AB  1   Living  1      SAB      IAB      Ectopic      Multiple  0   Live Births  1           Social History Social History   Socioeconomic History   Marital status: Significant Other    Spouse name: Not on file   Number of children: Not on file   Years of education: Not on file   Highest education level: Not on file  Occupational History   Not on file  Tobacco Use   Smoking status: Never   Smokeless tobacco: Never  Vaping Use   Vaping Use: Never used  Substance and Sexual Activity   Alcohol use: No   Drug use: No   Sexual activity: Yes   Other Topics Concern   Not on file  Social History Narrative   Not on file   Social Determinants of Health   Financial Resource Strain: Not on file  Food Insecurity: Not on file  Transportation Needs: Not on file  Physical Activity: Not on file  Stress: Not on file  Social Connections: Not on file    Family History: Family History  Problem Relation Age of Onset   Hypertension Mother    Diabetes Mother    Diabetes Maternal Aunt    Diabetes Maternal Uncle    Diabetes Maternal Grandfather     Allergies: No Known Allergies  Medications Prior to Admission  Medication Sig Dispense Refill Last Dose   acetaminophen (TYLENOL) 500 MG tablet Take 500 mg by mouth every 6 (six) hours as needed.   02/26/2021   ALBUTEROL SULFATE IN Inhale into the lungs.   Past Month   Prenatal Vit-Fe Fumarate-FA (PRENATAL MULTIVITAMIN) TABS tablet Take 1 tablet by mouth daily at 12 noon.   02/26/2021     Review of Systems   All systems reviewed and negative except as stated in HPI  Blood pressure 115/64, pulse 68,  temperature 98 F (36.7 C), temperature source Oral, resp. rate 18, height 5\' 4"  (1.626 m), weight 66.2 kg, last menstrual period 05/01/2020, unknown if currently breastfeeding. General appearance: alert, cooperative, and no distress Lungs: clear to auscultation bilaterally Heart: regular rate and rhythm Abdomen: soft, non-tender; bowel sounds normal Pelvic: deferred Extremities: Homans sign is negative, no sign of DVT  Fetal monitoringBaseline: 130 bpm, Variability: moderate, Accelerations: +accels, and Decelerations: Absent Uterine activity q2-59min Dilation: 6.5 Effacement (%): 80 Station: -1 Exam by:: [email or web address], RN   Prenatal labs: ABO, Rh: --/--/B POS (11/23 1842) Antibody: NEG (11/23 1842) Rubella:  immune RPR:   neg HBsAg:   neg HIV: Non Reactive (04/23 1959)  GBS:   positive   Prenatal Transfer Tool  Maternal Diabetes: No Genetic Screening: Normal Maternal  Ultrasounds/Referrals: Normal Fetal Ultrasounds or other Referrals:  None Maternal Substance Abuse:  No Significant Maternal Medications:  None Significant Maternal Lab Results: Group B Strep positive  Results for orders placed or performed during the hospital encounter of 02/27/21 (from the past 24 hour(s))  CBC   Collection Time: 02/27/21  6:42 PM  Result Value Ref Range   WBC 5.5 4.0 - 10.5 K/uL   RBC 3.60 (L) 3.87 - 5.11 MIL/uL   Hemoglobin 9.7 (L) 12.0 - 15.0 g/dL   HCT 03/01/21 (L) 72.0 - 94.7 %   MCV 83.6 80.0 - 100.0 fL   MCH 26.9 26.0 - 34.0 pg   MCHC 32.2 30.0 - 36.0 g/dL   RDW 09.6 28.3 - 66.2 %   Platelets 179 150 - 400 K/uL   nRBC 0.0 0.0 - 0.2 %  Type and screen [facility]   Collection Time: 02/27/21  6:42 PM  Result Value Ref Range   ABO/RH(D) B POS    Antibody Screen NEG    Sample Expiration      03/02/2021,2359 Performed at [facility], [address], [place], [place]   Resp Panel by RT-PCR (Flu A&B, Covid) Nasopharyngeal Swab   Collection Time: 02/27/21  6:46 PM   Specimen: Nasopharyngeal Swab; Nasopharyngeal(NP) swabs in vial transport medium  Result Value Ref Range   SARS Coronavirus 2 by RT PCR NEGATIVE NEGATIVE   Influenza A by PCR NEGATIVE NEGATIVE   Influenza B by PCR NEGATIVE NEGATIVE    Patient Active Problem List   Diagnosis Date Noted   Indication for care in labor and delivery, antepartum 02/27/2021   Molar pregnancy 09/23/2019    Assessment/Plan:  Yolanda Harmon is a 25 y.o. G3P1011 at 103w2d here for active labor  #Labor: continue expectant management #Pain: Desires epidural #FWB: Cat. I #ID:  PCN per protol #MOF: breast #MOC:IUD as outpatient   [redacted]w[redacted]d, DO  02/27/2021, 8:44 PM

## 2021-02-27 NOTE — Discharge Summary (Addendum)
Postpartum Discharge Summary    Patient Name: Yolanda Harmon DOB: 08-30-1995 MRN: [id number]  Date of admission: 02/27/2021 Delivery date:02/27/2021  Delivering provider:   Date of discharge: 03/01/2021  Admitting diagnosis: Indication for care in labor and delivery, antepartum [O75.9] Intrauterine pregnancy: [redacted]w[redacted]d    Secondary diagnosis:  Principal Problem:   Indication for care in labor and delivery, antepartum  Additional problems: Postpartum hemorrhage    Discharge diagnosis: Term Pregnancy Delivered and PCarmen                                             Post partum procedures:blood transfusion Augmentation:  none Complications: [id number] Hospital course: Onset of Labor With Vaginal Delivery      25y.o. yo G3P1011 at 411w2das admitted in Active Labor on 02/27/2021. Patient had an uncomplicated labor course as follows:  Membrane Rupture Time/Date: 9:15 PM ,02/27/2021   Delivery Method:Vaginal, Spontaneous  Episiotomy: None none Lacerations:  None none [facility]  JADA placed and she received TXA, and methergine. Her post hemorrhage Hgb was 6.5 and she was given two units of pRBCs Patient otherwise had an uncomplicated postpartum course.  She is ambulating, tolerating a regular diet, passing flatus, and urinating well. Patient is discharged home in stable condition on 03/01/21.  Newborn Data: Birth date:02/27/2021  Birth time:9:22 PM  Gender:Female  Living status:Living  Apgars:9 ,9  Weight:4082 g   Magnesium Sulfate received: No BMZ received: No Rhophylac:No MMR:No T-DaP:Given prenatally Flu: No Transfusion:Yes- 2u pRBC  Physical exam  Vitals:   02/28/21 2035 03/01/21 0500 03/01/21 0826 03/01/21 0849  BP: (!) 94/59 (!) 84/53 (!) 98/56 100/68  Pulse: 72 100 67 69  Resp:  16 18 18   Temp: 98.4 F (36.9 C) 98.8 F (37.1 C) 98 F (36.7 C) 97.8 F (36.6 C)  TempSrc:   Oral Tympanic  SpO2: 100% 100% 100%  100%  Weight:      Height:       General: alert, cooperative, and no distress Lochia: appropriate Uterine Fundus: firm DVT Evaluation: No evidence of DVT seen on physical exam. No cords or calf tenderness. No significant calf/ankle edema. Labs: Lab Results  Component Value Date   WBC 11.3 (H) 03/01/2021   HGB 6.5 (LL) 03/01/2021   HCT 20.3 (L) 03/01/2021   MCV 83.5 03/01/2021   PLT 176 03/01/2021   CMP Latest Ref Rng & Units 02/26/2020  Glucose 70 - 99 mg/dL 94  BUN 6 - 20 mg/dL 10  Creatinine 0.44 - 1.00 mg/dL 0.73  Sodium 135 - 145 mmol/L 135  Potassium 3.5 - 5.1 mmol/L 4.2  Chloride 98 - 111 mmol/L 104  CO2 22 - 32 mmol/L 28  Calcium 8.9 - 10.3 mg/dL 9.2  Total Protein 6.5 - 8.1 g/dL 7.9  Total Bilirubin 0.3 - 1.2 mg/dL 1.0  Alkaline Phos 38 - 126 U/L 47  AST 15 - 41 U/L 26  ALT 0 - 44 U/L 24   Edinburgh Score: Edinburgh Postnatal Depression Scale Screening Tool 02/28/2021  I have been able to laugh and see the funny side of things. 1  I have looked forward with enjoyment to things. 1  I have blamed myself unnecessarily when things went wrong. 3  I have been anxious or worried for no good reason. 2  I have felt  scared or panicky for no good reason. 2  Things have been getting on top of me. 2  I have been so unhappy that I have had difficulty sleeping. 2  I have felt sad or miserable. 1  I have been so unhappy that I have been crying. 1  The thought of harming myself has occurred to me. 0  Edinburgh Postnatal Depression Scale Total 15     After visit meds:  Allergies as of 03/01/2021   No Known Allergies      Medication List     TAKE these medications    acetaminophen 325 MG tablet Commonly known as: Tylenol Take 2 tablets (650 mg total) by mouth every 4 (four) hours as needed (for pain scale < 4). What changed:  medication strength how much to take when to take this reasons to take this   ALBUTEROL SULFATE IN Inhale into the lungs.    ibuprofen 600 MG tablet Commonly known as: ADVIL Take 1 tablet (600 mg total) by mouth every 6 (six) hours.   measles, mumps & rubella vaccine injection Commonly known as: MMR Inject 0.5 mLs into the skin once for 1 dose.   prenatal multivitamin Tabs tablet Take 1 tablet by mouth daily at 12 noon.   Tdap 5-2.5-18.5 LF-MCG/0.5 injection Commonly known as: BOOSTRIX Inject 0.5 mLs into the muscle once for 1 dose.         Discharge home in stable condition Infant Feeding: Bottle and Breast Infant Disposition:home with mother Discharge instruction: per After Visit Summary and Postpartum booklet. Activity: Advance as tolerated. Pelvic rest for 6 weeks.  Diet: routine diet Future Appointments: No future appointments.  Follow up Visit: Message sent to [facility] by Dr. Cy Blamer on 11/25 for 1 week PP follow up for mood check Please schedule this patient for a In person postpartum visit in 1 week with the following provider: Any provider. Additional Postpartum F/U:Postpartum Depression checkup Low risk pregnancy complicated by:  PP hemorrhage Delivery mode:  Vaginal, Spontaneous  Anticipated Birth Control:  IUD PP   03/01/2021 Orvis Brill, DO  GME ATTESTATION:  I saw and evaluated the patient. I agree with the findings and the plan of care as documented in the resident's note and have made all necessary changes  Renard Matter, MD, MPH OB Fellow, [place] [facility] 03/01/2021 10:04 AM

## 2021-02-27 NOTE — Anesthesia Preprocedure Evaluation (Deleted)
Anesthesia Evaluation  Patient identified by MRN, date of birth, ID band Patient awake    Reviewed: Allergy & Precautions, Patient's Chart, lab work & pertinent test results  Airway Mallampati: II  TM Distance: >3 FB Neck ROM: Full    Dental no notable dental hx.    Pulmonary neg pulmonary ROS,    Pulmonary exam normal breath sounds clear to auscultation       Cardiovascular negative cardio ROS Normal cardiovascular exam Rhythm:Regular Rate:Normal     Neuro/Psych  Headaches, negative psych ROS   GI/Hepatic Neg liver ROS, GERD  ,  Endo/Other  negative endocrine ROS  Renal/GU negative Renal ROS  negative genitourinary   Musculoskeletal negative musculoskeletal ROS (+)   Abdominal   Peds  Hematology  (+) anemia ,   Anesthesia Other Findings   Reproductive/Obstetrics (+) Pregnancy                             Anesthesia Physical Anesthesia Plan  ASA: 2  Anesthesia Plan:    Post-op Pain Management:    Induction:   PONV Risk Score and Plan:   Airway Management Planned: Natural Airway  Additional Equipment:   Intra-op Plan:   Post-operative Plan:   Informed Consent: I have reviewed the patients History and Physical, chart, labs and discussed the procedure including the risks, benefits and alternatives for the proposed anesthesia with the patient or authorized representative who has indicated his/her understanding and acceptance.       Plan Discussed with: Anesthesiologist  Anesthesia Plan Comments: (Patient delivered prior to procedure being performed. No Epidural performed.)       Anesthesia Quick Evaluation

## 2021-02-27 NOTE — Lactation Note (Addendum)
This note was copied from a baby's chart. Lactation Consultation Note  Patient Name: Boy Elizabelle Fite GYFVC'B Date: 02/27/2021 Reason for consult: L&D Initial assessment;Mother's request;Term;Breastfeeding assistance;Other (Comment) (PPH) Age:24 hours Spanish translator Lily on site to communicate with parents.  LC assisted with getting more depth in the latch in prone position. Mom recent PPH and falling asleep in midst of latch assistance.  LC left infant swaddled in Dad's arms.  LC alerted RN, Durwin Reges, Mom need set up of DEBP following PPH once on the floor. RN to access flange size and go over pump when Mom brought to the floor.   Maternal Data    Feeding Mother's Current Feeding Choice: Breast Milk  LATCH Score                    Lactation Tools Discussed/Used    Interventions Interventions: Breast feeding basics reviewed;Assisted with latch;Position options  Discharge    Consult Status Consult Status: Follow-up from L&D Date: 02/28/21 Follow-up type: In-patient    Nain Rudd  Nicholson-Springer 02/27/2021, 10:25 PM

## 2021-02-27 NOTE — Progress Notes (Addendum)
Chaplain responded to Code hHmorrhage called in by Operator.  Upon arriving to room, RN advised that [facility] services are not needed at this time.  Please call if support is needed.    Belia Heman, [place], M.Christell Faith [phone]      02/27/21 2100  Clinical Encounter Type  Visited With Patient not available  Visit Type Initial;Code  Referral From Physician  Consult/Referral To Chaplain  Stress Factors  Patient Stress Factors Health changes

## 2021-02-27 NOTE — MAU Note (Signed)
Pt reprots ctx since early this morning about 1-5 min apart now. Denies any vag bleeding or leaking. Good feal movement reported. 5 cm in office.

## 2021-02-28 ENCOUNTER — Encounter (HOSPITAL_COMMUNITY): Payer: Self-pay | Admitting: Obstetrics & Gynecology

## 2021-02-28 LAB — CBC
HCT: 24.5 % — ABNORMAL LOW (ref 36.0–46.0)
Hemoglobin: 7.8 g/dL — ABNORMAL LOW (ref 12.0–15.0)
MCH: 26.7 pg (ref 26.0–34.0)
MCHC: 31.8 g/dL (ref 30.0–36.0)
MCV: 83.9 fL (ref 80.0–100.0)
Platelets: 175 10*3/uL (ref 150–400)
RBC: 2.92 MIL/uL — ABNORMAL LOW (ref 3.87–5.11)
RDW: 13.1 % (ref 11.5–15.5)
WBC: 12.8 10*3/uL — ABNORMAL HIGH (ref 4.0–10.5)
nRBC: 0 % (ref 0.0–0.2)

## 2021-02-28 LAB — RPR: RPR Ser Ql: NONREACTIVE

## 2021-02-28 MED ORDER — METHYLERGONOVINE MALEATE 0.2 MG PO TABS
0.2000 mg | ORAL_TABLET | ORAL | Status: AC | PRN
  Administered 2021-02-28: 0.2 mg via ORAL
  Filled 2021-02-28: qty 1

## 2021-02-28 MED ORDER — DIBUCAINE (PERIANAL) 1 % EX OINT: 1.0000 "application " | TOPICAL_OINTMENT | CUTANEOUS | Status: DC | PRN

## 2021-02-28 MED ORDER — SIMETHICONE 80 MG PO CHEW: 80.0000 mg | CHEWABLE_TABLET | ORAL | Status: DC | PRN

## 2021-02-28 MED ORDER — ONDANSETRON HCL 4 MG/2ML IJ SOLN: 4.0000 mg | INTRAMUSCULAR | Status: DC | PRN

## 2021-02-28 MED ORDER — COCONUT OIL OIL: 1.0000 "application " | TOPICAL_OIL | Status: DC | PRN

## 2021-02-28 MED ORDER — SENNOSIDES-DOCUSATE SODIUM 8.6-50 MG PO TABS
2.0000 | ORAL_TABLET | Freq: Every day | ORAL | Status: DC
  Administered 2021-02-28 – 2021-03-01 (×2): 2 via ORAL
  Filled 2021-02-28 (×2): qty 2

## 2021-02-28 MED ORDER — ACETAMINOPHEN 325 MG PO TABS
650.0000 mg | ORAL_TABLET | ORAL | Status: DC | PRN
  Administered 2021-02-28 (×3): 650 mg via ORAL
  Filled 2021-02-28 (×3): qty 2

## 2021-02-28 MED ORDER — PRENATAL MULTIVITAMIN CH
1.0000 | ORAL_TABLET | Freq: Every day | ORAL | Status: DC
  Administered 2021-02-28 – 2021-03-01 (×2): 1 via ORAL
  Filled 2021-02-28 (×2): qty 1

## 2021-02-28 MED ORDER — IBUPROFEN 600 MG PO TABS
600.0000 mg | ORAL_TABLET | Freq: Four times a day (QID) | ORAL | Status: DC
  Administered 2021-02-28 – 2021-03-01 (×7): 600 mg via ORAL
  Filled 2021-02-28 (×7): qty 1

## 2021-02-28 MED ORDER — WITCH HAZEL-GLYCERIN EX PADS: 1.0000 "application " | MEDICATED_PAD | CUTANEOUS | Status: DC | PRN

## 2021-02-28 MED ORDER — METHYLERGONOVINE MALEATE 0.2 MG/ML IJ SOLN: 0.2000 mg | INTRAMUSCULAR | Status: AC | PRN

## 2021-02-28 MED ORDER — ZOLPIDEM TARTRATE 5 MG PO TABS: 5.0000 mg | ORAL_TABLET | Freq: Every evening | ORAL | Status: DC | PRN

## 2021-02-28 MED ORDER — TETANUS-DIPHTH-ACELL PERTUSSIS 5-2.5-18.5 LF-MCG/0.5 IM SUSY: 0.5000 mL | PREFILLED_SYRINGE | Freq: Once | INTRAMUSCULAR | Status: DC

## 2021-02-28 MED ORDER — BENZOCAINE-MENTHOL 20-0.5 % EX AERO: 1.0000 "application " | INHALATION_SPRAY | CUTANEOUS | Status: DC | PRN

## 2021-02-28 MED ORDER — DIPHENHYDRAMINE HCL 25 MG PO CAPS: 25.0000 mg | ORAL_CAPSULE | Freq: Four times a day (QID) | ORAL | Status: DC | PRN

## 2021-02-28 MED ORDER — ONDANSETRON HCL 4 MG PO TABS: 4.0000 mg | ORAL_TABLET | ORAL | Status: DC | PRN

## 2021-02-28 MED ORDER — MEASLES, MUMPS & RUBELLA VAC IJ SOLR: 0.5000 mL | Freq: Once | INTRAMUSCULAR | Status: DC

## 2021-02-28 NOTE — Lactation Note (Signed)
This note was copied from a baby's chart. Lactation Consultation Note  Patient Name: Yolanda Harmon UPBDH'D Date: 02/28/2021   Age:24 hours  Mom declined assist at this time, but is interested in speaking to our Spanish-speaking LC later today.    [name] [facility] 02/28/2021, 9:56 AM

## 2021-02-28 NOTE — Progress Notes (Signed)
Rn that gave me report states that the doctor told her to make me aware that she would like the patient to receive a dose of methergine at 200 am .Methergine will be given and will continue to monitor patient

## 2021-02-28 NOTE — Progress Notes (Signed)
Post Partum Day 1 Subjective: no complaints, up ad lib, voiding, and tolerating PO  Objective: Blood pressure 102/65, pulse (!) 57, temperature 98.2 F (36.8 C), temperature source Oral, resp. rate 18, height 5\' 4"  (1.626 m), weight 66.2 kg, last menstrual period 05/01/2020, SpO2 100 %, unknown if currently breastfeeding.  Physical Exam:  General: alert, cooperative, and no distress Lochia: appropriate Uterine Fundus: firm Incision:  DVT Evaluation: No evidence of DVT seen on physical exam.  Recent Labs    02/27/21 2153 02/28/21 0519  HGB 8.8* 7.8*  HCT 27.6* 24.5*    Assessment/Plan: Plan for discharge tomorrow and Breastfeeding Recheck H/H tomorrow prior to discharge   LOS: 1 day   03/02/21 02/28/2021, 11:56 AM

## 2021-02-28 NOTE — Lactation Note (Signed)
This note was copied from a baby's chart. Lactation Consultation Note  Patient Name: Boy Tyrihanna Wingert PYPPJ'K Date: 02/28/2021 Reason for consult: Initial assessment;Term;Other (Comment) [facility]) Age:25  Initial visit to 14 hours old infant of a P2 mother. Mother states this pregnancy and delivery was very different than the first one. Mother reports good feedings but long. LC encouraged to maintain baby awake at breast and demonstrated strategies.  LC demonstrated hand expression, collected ~3 mL and spoonfed. Mother latches with minimal assistance.  Discussed normal newborn behavior and patterns, signs of good milk transfer, hunger cues, tummy size and benefits of skin to skin.  Plan: 1-Deep, comfortable latch and breastfeeding on demand or 8-12 times in 24h period. 2-Encouraged maternal rest, hydration and food intake.  3-Contact LC as needed for feeds/support/concerns/questions   All questions answered at this time. Provided Lactation services brochure and promoted [employer] booklet information.     Maternal Data Has patient been taught Hand Expression?: Yes Does the patient have breastfeeding experience prior to this delivery?: Yes How long did the patient breastfeed?: 2 years (~6years ago)  Feeding Mother's Current Feeding Choice: Breast Milk  LATCH Score Latch: Grasps breast easily, tongue down, lips flanged, rhythmical sucking.  Audible Swallowing: Spontaneous and intermittent  Type of Nipple: Everted at rest and after stimulation  Comfort (Breast/Nipple): Soft / non-tender  Hold (Positioning): Assistance needed to correctly position infant at breast and maintain latch.  LATCH Score: 9  Interventions Interventions: Breast feeding basics reviewed;Assisted with latch;Skin to skin;Hand express;Breast massage;Adjust position;Support pillows;Expressed milk;Education;LC Psychologist, educational  Discharge WIC Program: Yes  Consult Status Consult Status:  Follow-up Date: 03/01/21 Follow-up type: In-patient    Aarav Burgett A Higuera Ancidey 02/28/2021, 11:59 AM
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
## 2021-03-01 LAB — CBC WITH DIFFERENTIAL/PLATELET
Abs Immature Granulocytes: 0.09 10*3/uL — ABNORMAL HIGH (ref 0.00–0.07)
Basophils Absolute: 0 10*3/uL (ref 0.0–0.1)
Basophils Relative: 0 %
Eosinophils Absolute: 0.2 10*3/uL (ref 0.0–0.5)
Eosinophils Relative: 1 %
HCT: 20.3 % — ABNORMAL LOW (ref 36.0–46.0)
Hemoglobin: 6.5 g/dL — CL (ref 12.0–15.0)
Immature Granulocytes: 1 %
Lymphocytes Relative: 27 %
Lymphs Abs: 3 10*3/uL (ref 0.7–4.0)
MCH: 26.7 pg (ref 26.0–34.0)
MCHC: 32 g/dL (ref 30.0–36.0)
MCV: 83.5 fL (ref 80.0–100.0)
Monocytes Absolute: 0.7 10*3/uL (ref 0.1–1.0)
Monocytes Relative: 7 %
Neutro Abs: 7.2 10*3/uL (ref 1.7–7.7)
Neutrophils Relative %: 64 %
Platelets: 176 10*3/uL (ref 150–400)
RBC: 2.43 MIL/uL — ABNORMAL LOW (ref 3.87–5.11)
RDW: 13.4 % (ref 11.5–15.5)
WBC: 11.3 10*3/uL — ABNORMAL HIGH (ref 4.0–10.5)
nRBC: 0 % (ref 0.0–0.2)

## 2021-03-01 LAB — CBC
HCT: 25.1 % — ABNORMAL LOW (ref 36.0–46.0)
Hemoglobin: 8.5 g/dL — ABNORMAL LOW (ref 12.0–15.0)
MCH: 28.5 pg (ref 26.0–34.0)
MCHC: 33.9 g/dL (ref 30.0–36.0)
MCV: 84.2 fL (ref 80.0–100.0)
Platelets: 195 10*3/uL (ref 150–400)
RBC: 2.98 MIL/uL — ABNORMAL LOW (ref 3.87–5.11)
RDW: 13.3 % (ref 11.5–15.5)
WBC: 10 10*3/uL (ref 4.0–10.5)
nRBC: 0 % (ref 0.0–0.2)

## 2021-03-01 LAB — PREPARE RBC (CROSSMATCH)

## 2021-03-01 MED ORDER — IBUPROFEN 600 MG PO TABS
600.0000 mg | ORAL_TABLET | Freq: Four times a day (QID) | ORAL | 0 refills | Status: AC
Start: 2021-03-01 — End: ?

## 2021-03-01 MED ORDER — LACTATED RINGERS IV BOLUS
1000.0000 mL | Freq: Once | INTRAVENOUS | Status: AC
  Administered 2021-03-01: 1000 mL via INTRAVENOUS

## 2021-03-01 MED ORDER — MEASLES, MUMPS & RUBELLA VAC IJ SOLR: 0.5000 mL | Freq: Once | INTRAMUSCULAR | 0 refills | Status: AC

## 2021-03-01 MED ORDER — SODIUM CHLORIDE 0.9% IV SOLUTION: Freq: Once | INTRAVENOUS | Status: AC

## 2021-03-01 MED ORDER — TETANUS-DIPHTH-ACELL PERTUSSIS 5-2.5-18.5 LF-MCG/0.5 IM SUSY: 0.5000 mL | PREFILLED_SYRINGE | Freq: Once | INTRAMUSCULAR | 0 refills | Status: AC

## 2021-03-01 MED ORDER — ACETAMINOPHEN 325 MG PO TABS: 650.0000 mg | ORAL_TABLET | ORAL | 0 refills | Status: AC | PRN

## 2021-03-01 NOTE — Lactation Note (Signed)
This note was copied from a baby's chart. Lactation Consultation Note  Patient Name: Yolanda Harmon FGBMS'X Date: 03/01/2021   Age:24 hours   LC Follow Up Note:  Attempted to visit with mother, however, she was asleep.     Maternal Data    Feeding    LATCH Score                    Lactation Tools Discussed/Used    Interventions    Discharge    Consult Status      [name] R Kacen Mellinger 03/01/2021, 1:26 PM

## 2021-03-01 NOTE — Progress Notes (Signed)
POSTPARTUM PROGRESS NOTE  Post Partum Day 2  Subjective:  Yolanda Harmon is a 25 y.o. [id number] s/p SVD at [redacted]w[redacted]d.  No acute events overnight.  Pt denies problems with ambulating, voiding or po intake.  She denies nausea or vomiting.  Pain is well controlled.  She has had flatus. Lochia small. Having fatigue and a left-sided headache, but no visual changes. Also having fatigue, but no weakness or dizziness.   Objective: Blood pressure (!) 84/53, pulse 100, temperature 98.8 F (37.1 C), resp. rate 16, height 5\' 4"  (1.626 m), weight 66.2 kg, last menstrual period 05/01/2020, SpO2 100 %, unknown if currently breastfeeding.  Physical Exam:  General: alert, cooperative and no distress Chest: no respiratory distress Heart:regular rate, distal pulses intact Abdomen: soft, nontender,  Uterine Fundus: firm, appropriately tender DVT Evaluation: No calf swelling or tenderness Extremities: No edema Skin: warm, dry  Recent Labs    02/28/21 0519 03/01/21 0426  HGB 7.8* 6.5*  HCT 24.5* 20.3*    Assessment/Plan: Yolanda Harmon is a 25 y.o. 25 s/p SVD at [redacted]w[redacted]d   PPD#2 - Doing well Contraception: IUD- at [facility] visit Feeding: Breast and bottle Dispo: Plan for discharge possibly later this afternoon s/p pRBC transfusion if Hgb stable  Anemia: EBL 1200 with Hgb 6.5 this AM. Received 1L LR bolus this am. No signs of bleeding, uterine fundus firm. Will transfuse 2u pRBC and re-evaluate later with post-transfusion H/H   LOS: 2 days   [facility], DO, 03/01/2021, 7:03 AM

## 2021-03-01 NOTE — Lactation Note (Signed)
This note was copied from a baby's chart. Lactation Consultation Note  Patient Name: Yolanda Harmon XBMWU'X Date: 03/01/2021 Reason for consult: Follow-up assessment Age:24 hours  P2 mother whose infant is now 22 hours old.  This is a term baby at 40+2 weeks.  Baby was swaddled and asleep next to mother when I arrived.  Mother had no questions/concerns related to breast feeding at this time.  Last LATCH score was a 9; voiding/stooling well.  Baby has been discharged; awaiting mother's discharge.  Father present.    Maternal Data    Feeding    LATCH Score                    Lactation Tools Discussed/Used    Interventions    Discharge Discharge Education: Engorgement and breast care  Consult Status Consult Status: Complete Date: 03/01/21 Follow-up type: In-patient    Jerald Villalona R Shemia Bevel 03/01/2021, 4:08 PM

## 2021-03-02 LAB — BPAM RBC
Blood Product Expiration Date: 202212082359
Blood Product Expiration Date: 202212082359
ISSUE DATE / TIME: 202211250825
ISSUE DATE / TIME: 202211251200
Unit Type and Rh: 7300
Unit Type and Rh: 7300

## 2021-03-02 LAB — TYPE AND SCREEN
ABO/RH(D): B POS
Antibody Screen: NEGATIVE
Unit division: 0
Unit division: 0

## 2021-03-04 ENCOUNTER — Inpatient Hospital Stay (HOSPITAL_COMMUNITY): Payer: Self-pay

## 2021-03-06 ENCOUNTER — Telehealth: Payer: Self-pay | Admitting: Clinical

## 2021-03-06 NOTE — Telephone Encounter (Signed)
Left HIPPA-compliant message to call back Jasmine from [facility] [employer] at [facility] at  [phone] [facility] office).

## 2021-03-13 ENCOUNTER — Telehealth (HOSPITAL_COMMUNITY): Payer: Self-pay | Admitting: *Deleted

## 2021-03-13 ENCOUNTER — Encounter (HOSPITAL_COMMUNITY): Payer: Self-pay | Admitting: Obstetrics & Gynecology

## 2021-03-13 NOTE — Telephone Encounter (Signed)
Hospital Discharge Follow-Up Call:  Patient reports that she is well.  She has passed several quarter sized blood clots in the last few days.  We reviewed parameters for when to call provider regarding amount of lochia.  EPDS today was 4 and she endorses this accurately reflects that she is doing well emotionally.  Patient reports that baby is well, but has "trouble breathing" sometimes.  With further clarification it seems that baby gulps when breastfeeding and then coughs a little, but color remains normal throughout feeding, and baby feeds until content.  She also says that baby "snores" when he sleeps, but again, he does not stop breathing and his color remains normal.  Baby is to be seen by pediatrician tomorrow for well-baby visit and this RN encouraged patient to share above with baby's provider.  Also suggested it would be good if provider or lactation consultant could watch a feeding during the appointment.  Patient reports that baby sleeps in a crib.  ABCs of Safe Sleep reviewed.

## 2022-01-27 IMAGING — US US OB < 14 WEEKS - US OB TV
1 series · 15 of 28 positions shown · non-contrast
Comparison: None.

CLINICAL DATA: Heavy vaginal bleeding and cramping with positive
pregnancy test

EXAM:
OBSTETRIC <14 WK US AND TRANSVAGINAL OB US
TECHNIQUE: Both transabdominal and transvaginal ultrasound examinations were
performed for complete evaluation of the gestation as well as the
maternal uterus, adnexal regions, and pelvic cul-de-sac.
Transvaginal technique was performed to assess early pregnancy.

[Series 1: us ob < 14 weeks - us ob tv · 58 acquisitions, 15 frames shown]
[im 1/58]
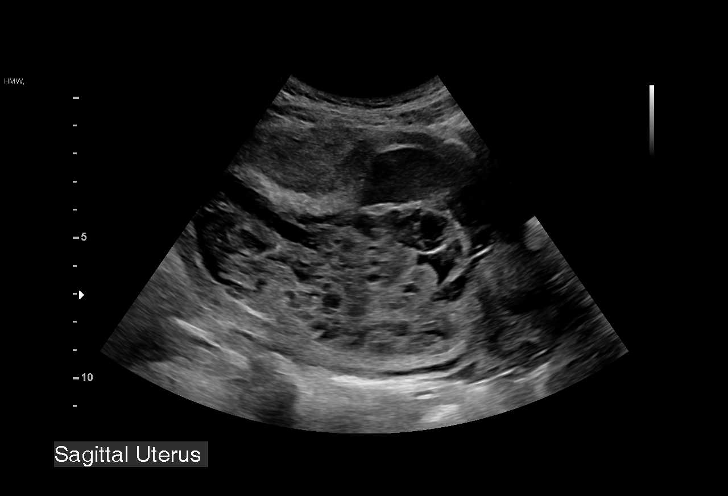
[im 5/58]
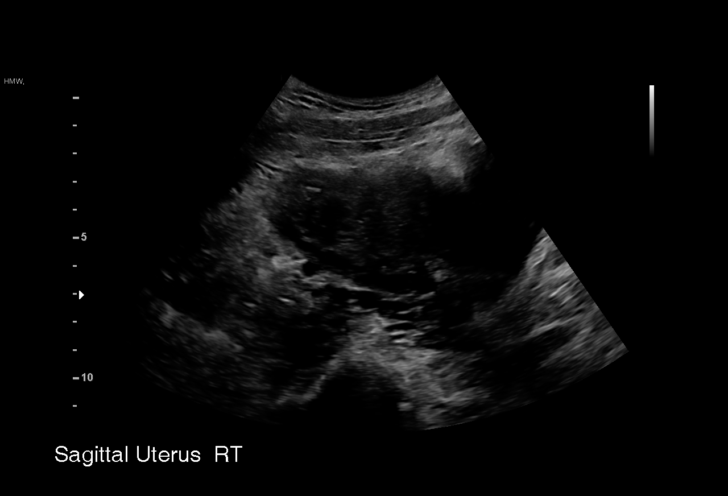
[im 9/58]
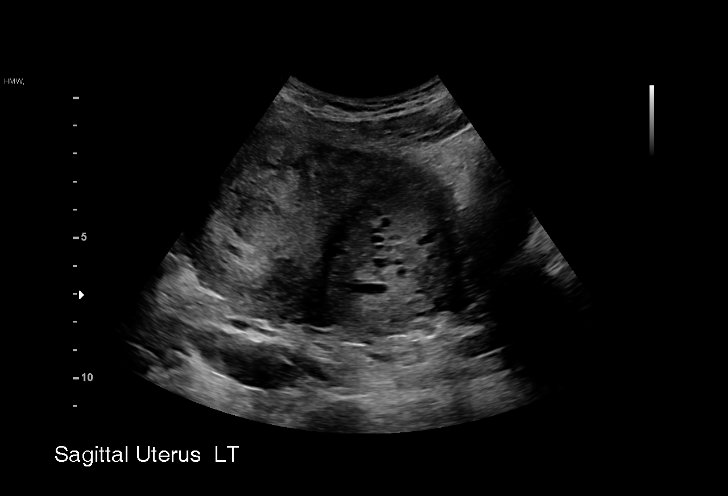
[im 13/58]
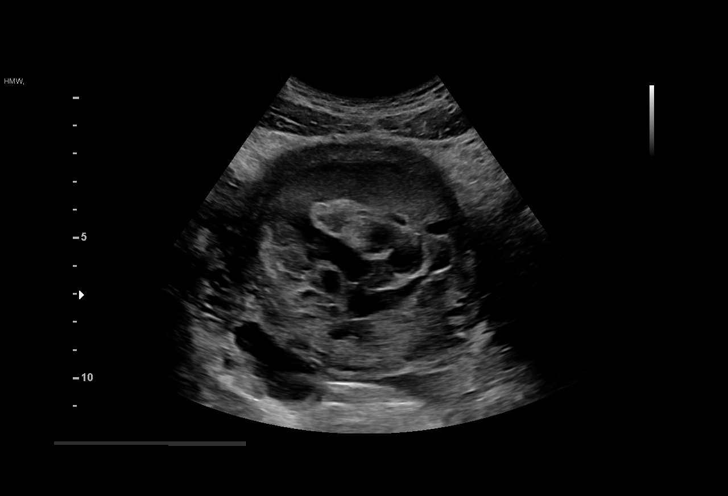
[im 17/58]
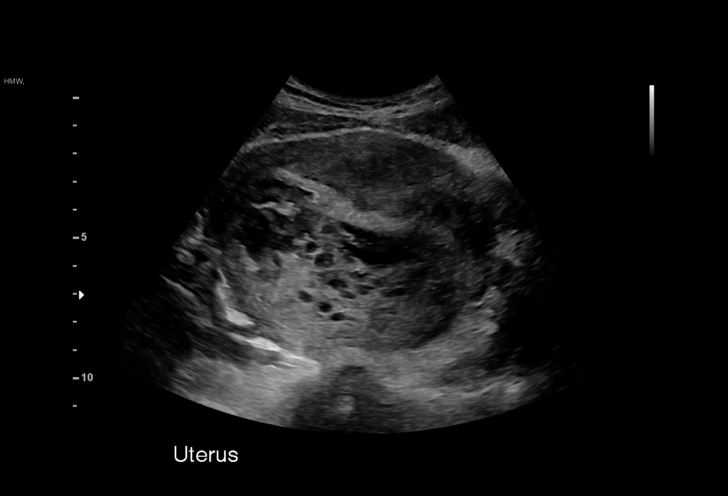
[im 22/58]
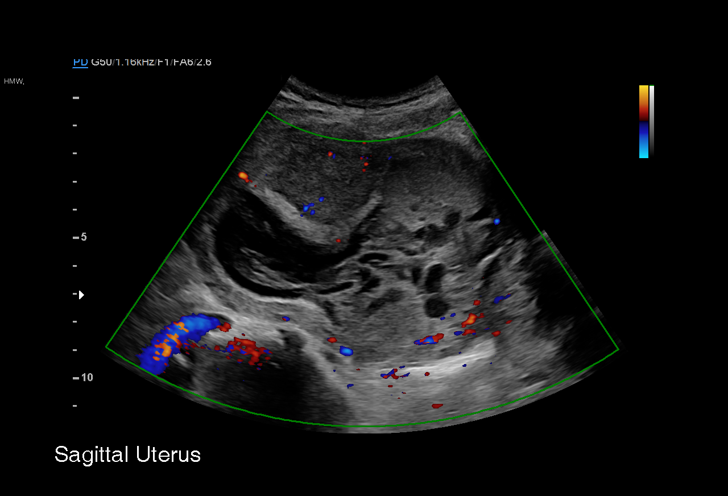
[im 26/58]
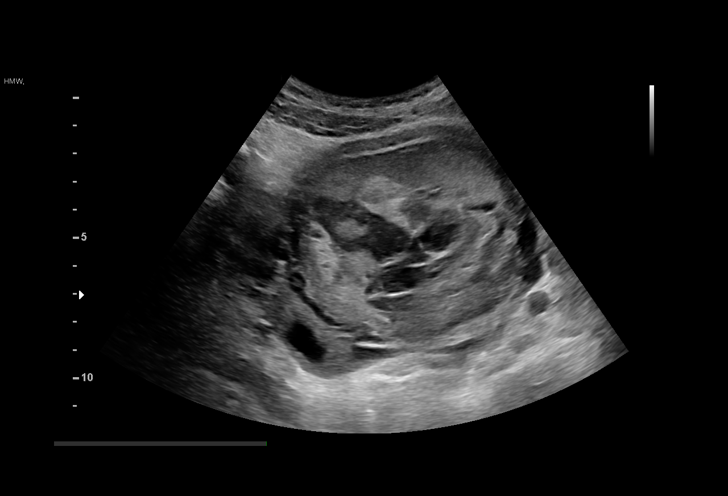
[im 30/58]
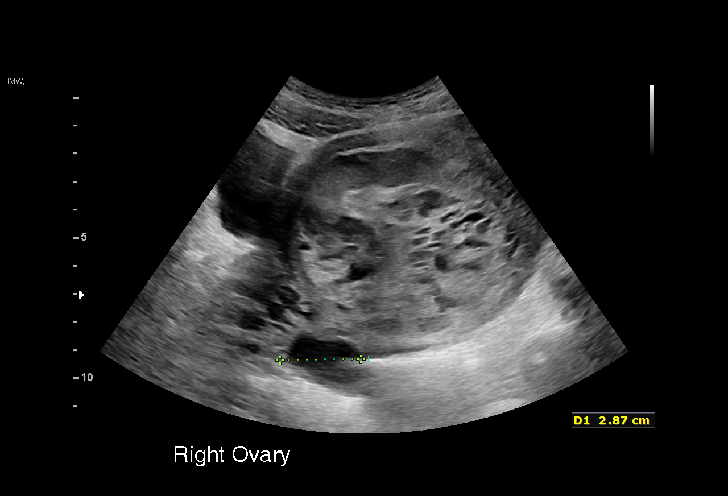
[im 32/58]
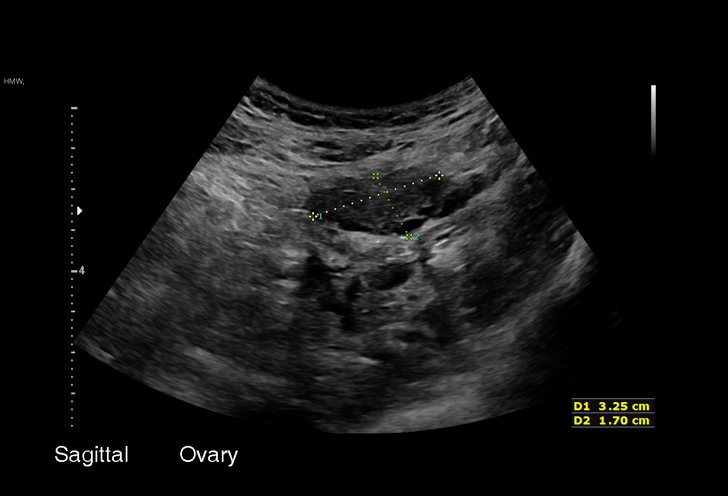
[im 36/58]
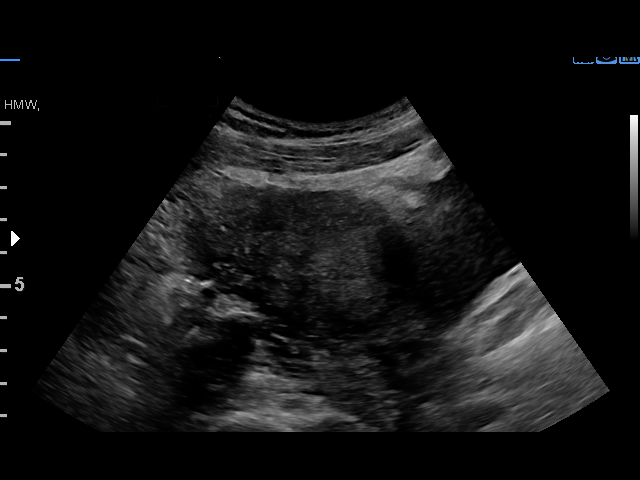
[im 41/58]
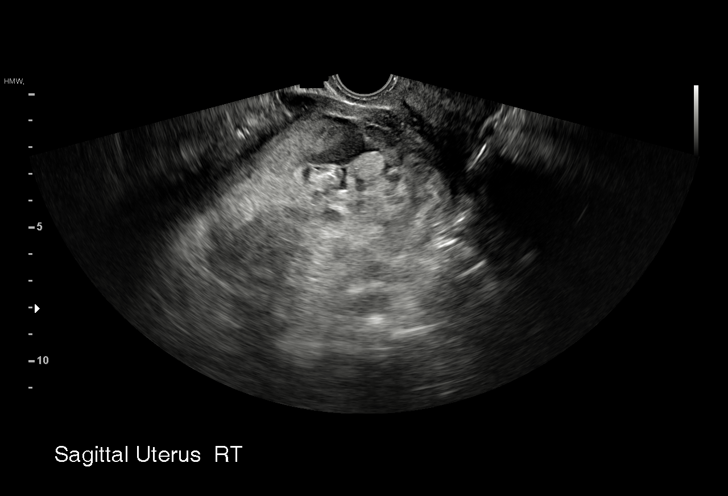
[im 45/58]
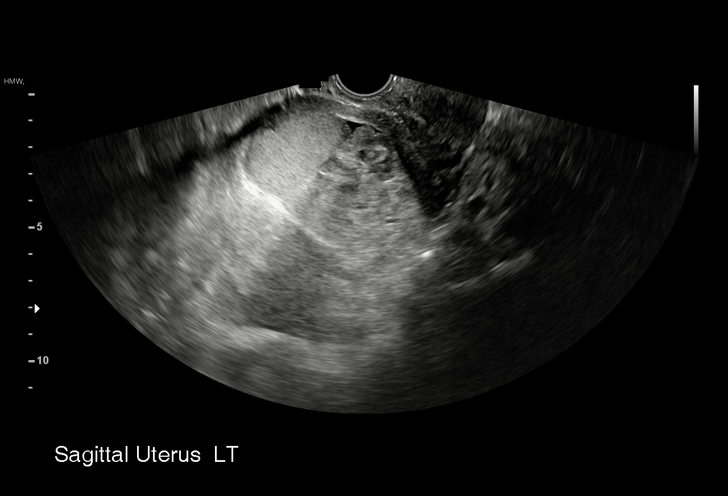
[im 49/58]
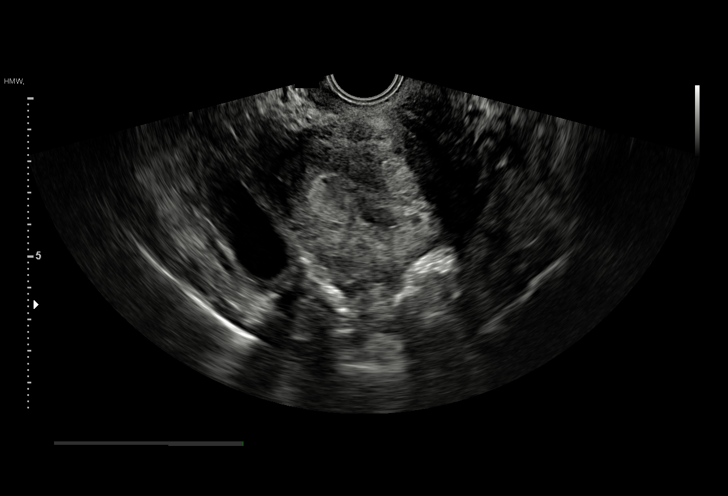
[im 53/58]
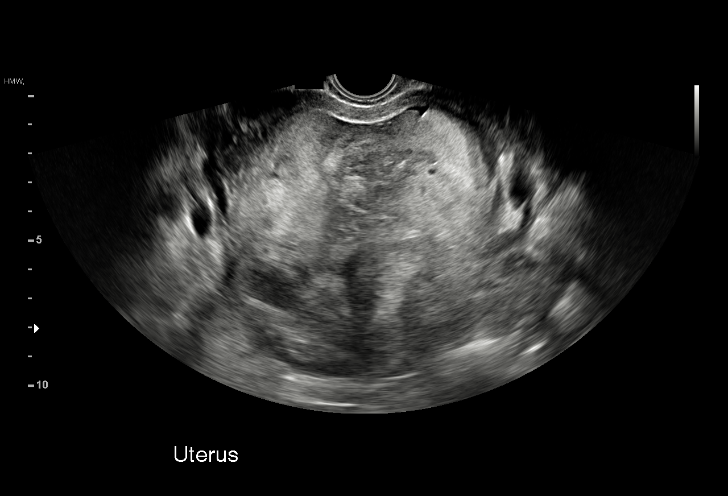
[im 58/58]
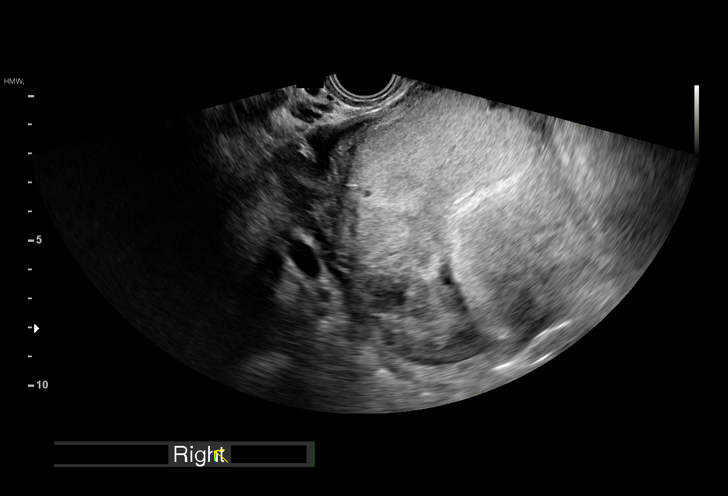

[15 of 28 positions shown; findings below may reference images not displayed]

FINDINGS: Large intrauterine gestational sac is identified. Thickened placenta
seen anteriorly on the left with heterogeneous cystic and solid
components within the gestational sac. No identifiable fetal parts.
Ultrasound imaging features most consistent with gestational
trophoblastic disease. The junction between the myometrium and
endometrium is well preserved anteriorly but given the bulkiness of
the trophoblastic disease, posterior myometrium is not well
visualized on either transabdominal or endovaginal scanning.

Maternal uterus/adnexae: Corpus luteum cyst identified right ovary.
Left ovary unremarkable. Possible trace free fluid in the cul-de-sac
(image 38).
IMPRESSION: Heterogeneous cystic and solid bulky lesion in the endometrial
cavity consistent with gestational trophoblastic disease.

Findings were discussed by telephone with Jp Borbon, NP, at the
time of study interpretation.

## 2023-02-03 IMAGING — US US MFM OB DETAIL+14 WK
1 series · 13 of 28 positions shown · non-contrast
Comparison: none

[Series 1: us mfm ob detail+14 wk · 129 acquisitions, 13 frames shown]
[im 5/129]
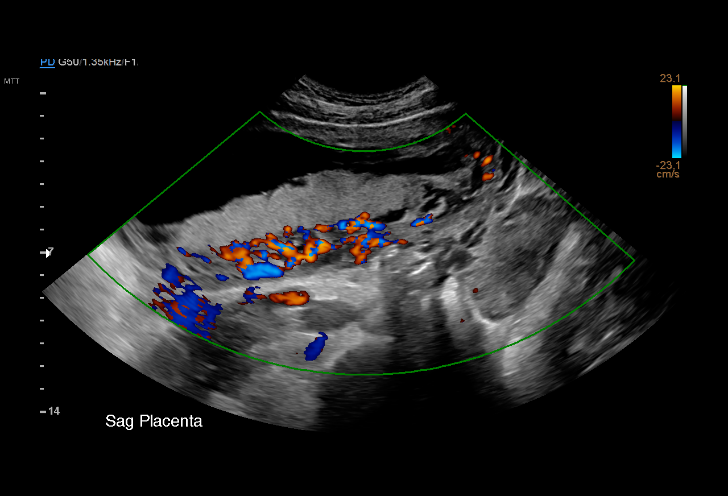
[im 15/129]
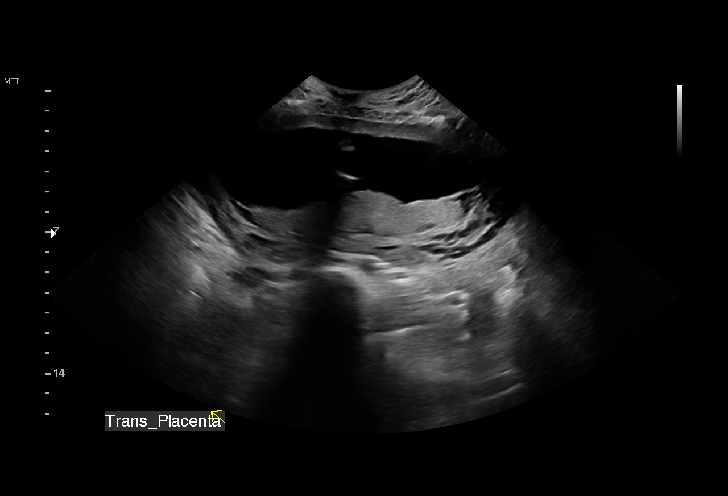
[im 24/129]
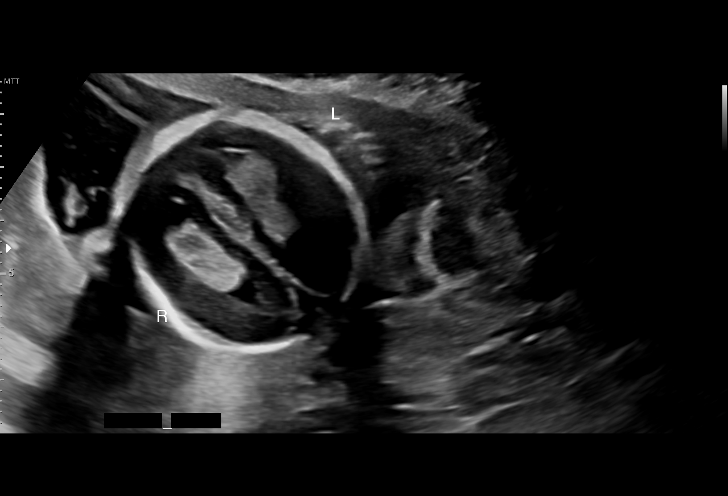
[im 34/129]
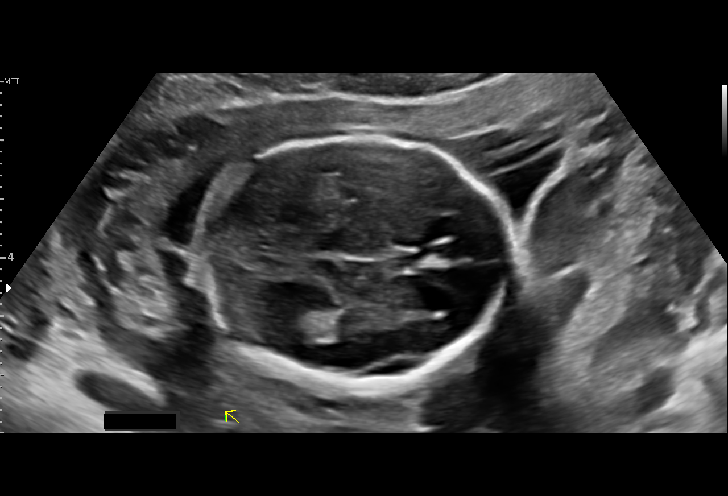
[im 43/129]
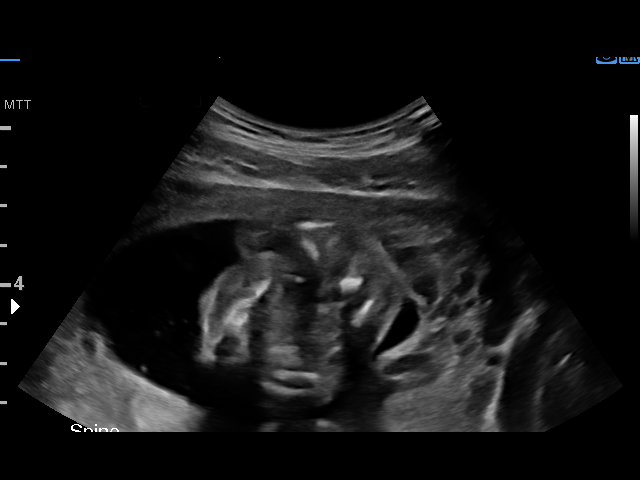
[im 53/129]
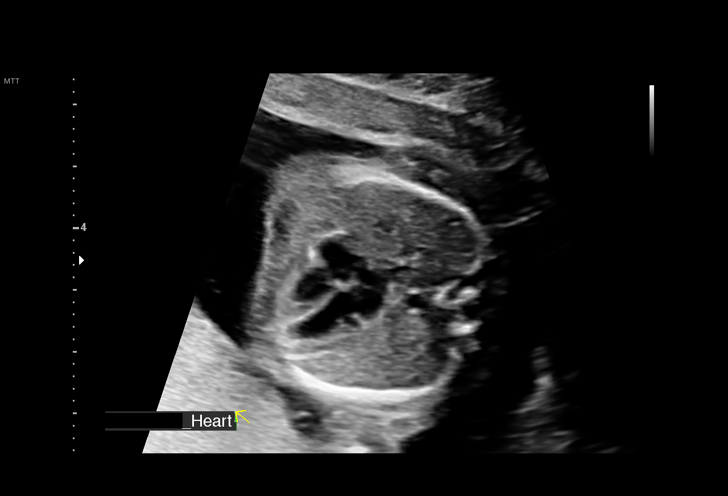
[im 67/129]
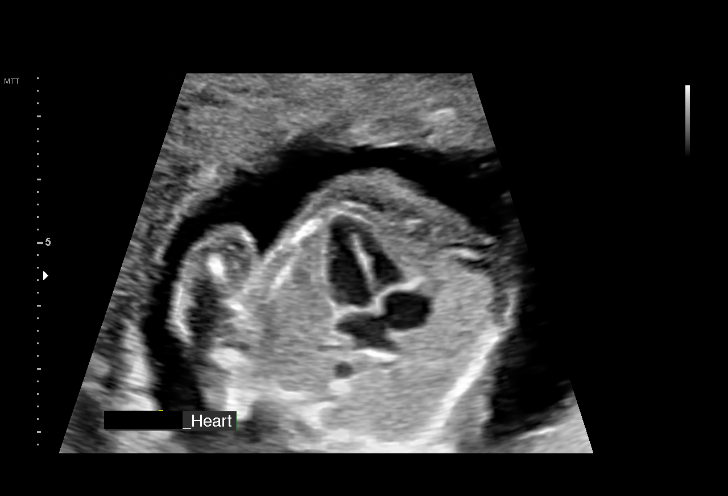
[im 76/129]
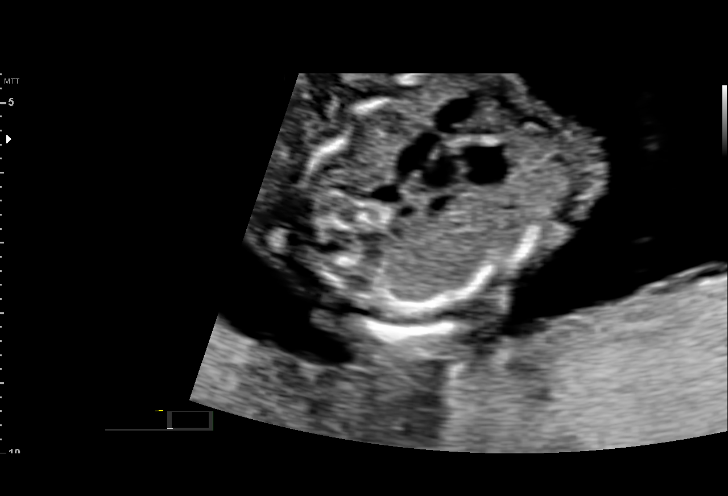
[im 86/129]
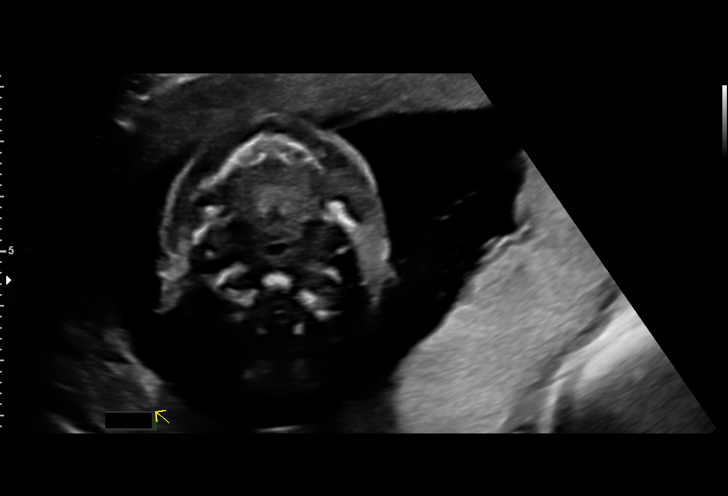
[im 95/129]
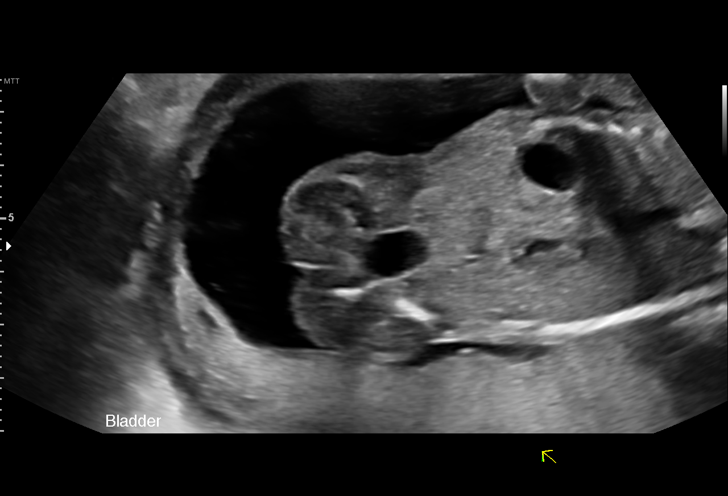
[im 105/129]
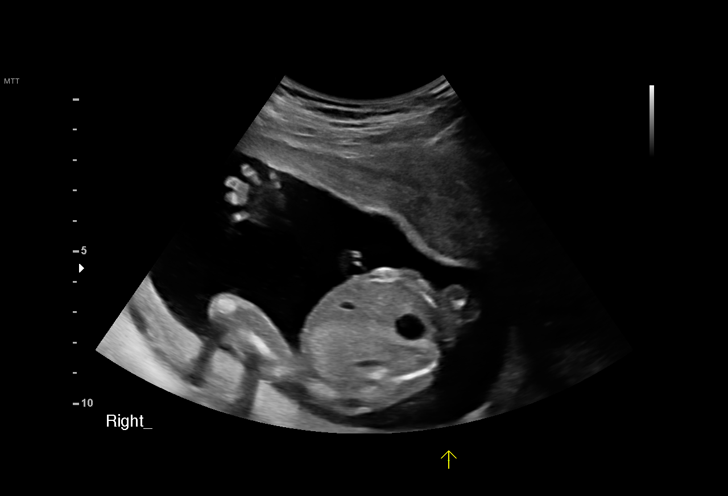
[im 114/129]
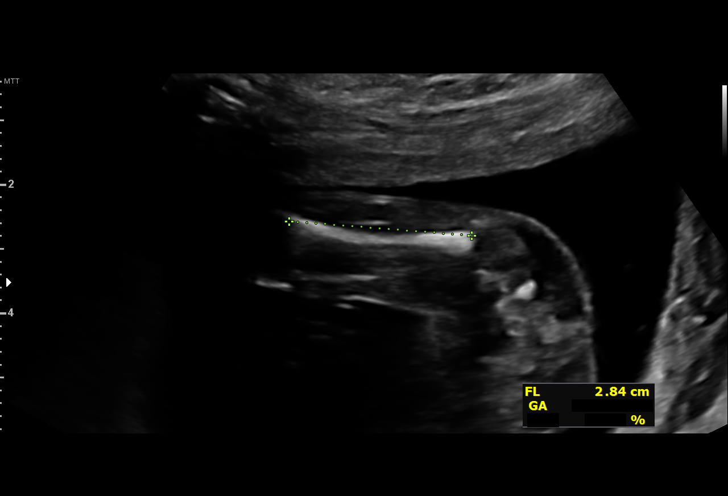
[im 124/129]
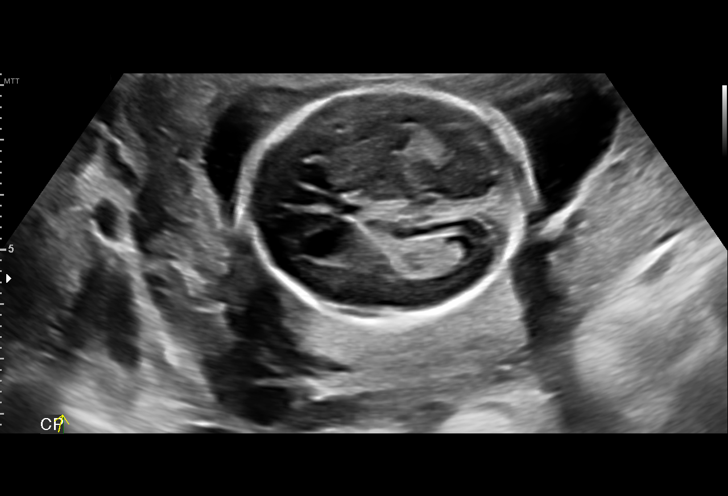

[13 of 28 positions shown; findings below may reference images not displayed]

[REDACTED]-
                   Faculty Physician

Indications

 18 weeks gestation of pregnancy
 Cystic Fibrosis (CF) Carrier, second trimester
 Abdominal pain in pregnancy (Right sided
 pain)
 Encounter for antenatal screening for
 malformations
 Poor obstetrical history (Molar pregnancy
 3836)
Fetal Evaluation

 Num Of Fetuses:         1
 Fetal Heart Rate(bpm):  141
 Cardiac Activity:       Observed
 Presentation:           Cephalic
 Placenta:               Posterior Previa
 P. Cord Insertion:      Visualized, central

 Amniotic Fluid
 AFI FV:      Within normal limits

                             Largest Pocket(cm)

Biometry
 BPD:      41.8  mm     G. Age:  18w 5d         48  %    CI:        76.69   %    70 - 86
                                                         FL/HC:      18.3   %    16.1 -
 HC:      151.2  mm     G. Age:  18w 1d         18  %    HC/AC:      1.06        1.09 -
 AC:      142.9  mm     G. Age:  19w 4d         76  %    FL/BPD:     66.0   %
 FL:       27.6  mm     G. Age:  18w 3d         33  %    FL/AC:      19.3   %    20 - 24
 HUM:      26.6  mm     G. Age:  18w 3d         44  %
 CER:      19.6  mm     G. Age:  19w 0d         56  %
 NFT:       3.0  mm
 LV:        7.9  mm
 CM:        6.1  mm

 Est. FW:     267  gm      0 lb 9 oz     61  %
Gestational Age

 LMP:           21w 1d        Date:  05/01/20                 EDD:   02/05/21
 U/S Today:     18w 5d                                        EDD:   02/22/21
 Best:          18w 5d     Det. By:  U/S (09/26/20)           EDD:   02/22/21
Anatomy

 Cranium:               Appears normal         LVOT:                   Appears normal
 Cavum:                 Appears normal         Aortic Arch:            Appears normal
 Ventricles:            Appears normal         Ductal Arch:            Appears normal
 Choroid Plexus:        Appears normal         Diaphragm:              Appears normal
 Cerebellum:            Appears normal         Stomach:                Appears normal, left
                                                                       sided
 Posterior Fossa:       Appears normal         Abdomen:                Appears normal
 Nuchal Fold:           Appears normal         Abdominal Wall:         Appears nml (cord
                                                                       insert, abd wall)
 Face:                  Appears normal         Cord Vessels:           Appears normal (3
                        (orbits and profile)                           vessel cord)
 Lips:                  Appears normal         Kidneys:                Appear normal
 Palate:                Appears normal         Bladder:                Appears normal
 Thoracic:              Appears normal         Spine:                  Appears normal
 Heart:                 Appears normal         Upper Extremities:      Appears normal
                        (4CH, axis, and
                        situs)
 RVOT:                  Appears normal         Lower Extremities:      Appears normal

 Other:  Fetus appears to be a male. Heels/feet and open hands/5th digits
         visualized. Nasal bone visualized.
Cervix Uterus Adnexa

 Cervix
 Length:           3.43  cm.
 Normal appearance by transabdominal scan.

 Uterus
 No abnormality visualized.

 Right Ovary
 Within normal limits.
 Left Ovary
 Within normal limits.

 Cul De Sac
 No free fluid seen.

 Adnexa
 No abnormality visualized.
Impression

 G3 P1.  Patient is here for fetal anatomy scan.  She had
 opted not to screen for fetal aneuploidies.
 Obstetric history significant for a term vaginal delivery.  In
 September 2019, molar pregnancy was diagnosed at 8 weeks.
 Subsequently, she recovered well with no metastasis.
 Patient is a carrier of cystic fibrosis mutation and has a new
 partner.  Her partner has not been screened.  She had
 genetic counseling appointment today but decided not to
 have genetic counseling.
 We performed a fetal anatomy scan. No markers of
 aneuploidies or fetal structural defects are seen. Fetal
 biometry is consistent with her previously-established dates.
 Amniotic fluid is normal and good fetal activity is seen.
 Patient understands the limitations of ultrasound in detecting
 fetal anomalies.

 On transabdominal scan, placenta previa seen.
 Patient does not give history of recent vaginal bleeding or
 postcoital bleeding.  I explained the findings that placenta
 previa may resolve with advancing gestation.
Recommendations

 -An appointment was made for her to return in 8 weeks for
 placental location evaluation and fetal growth.
                 Obrien, Dandre

## 2023-03-31 IMAGING — US US MFM OB FOLLOW-UP
1 series · 14 of 28 positions shown · non-contrast
Comparison: none

[Series 1: us mfm ob follow-up · 41 acquisitions, 14 frames shown]
[im 2/41]
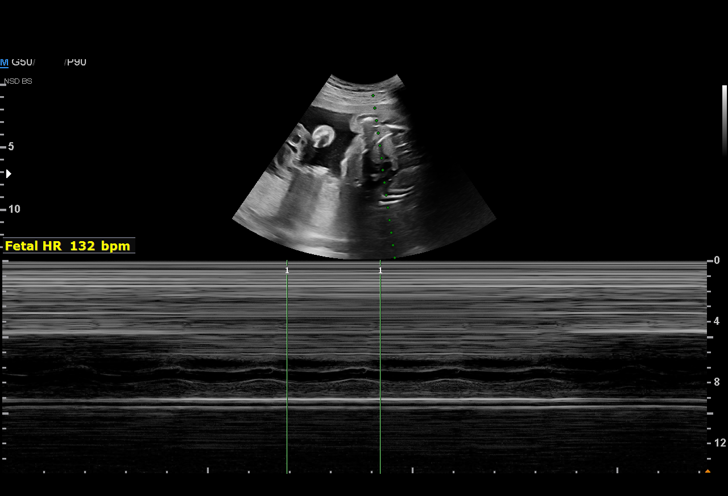
[im 5/41]
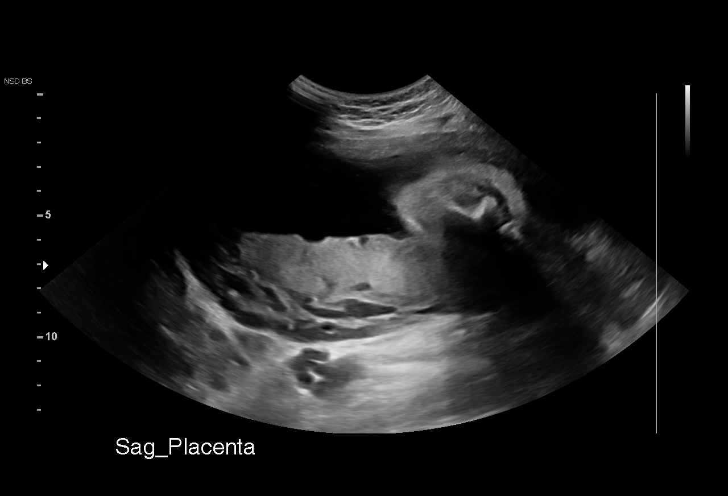
[im 8/41]
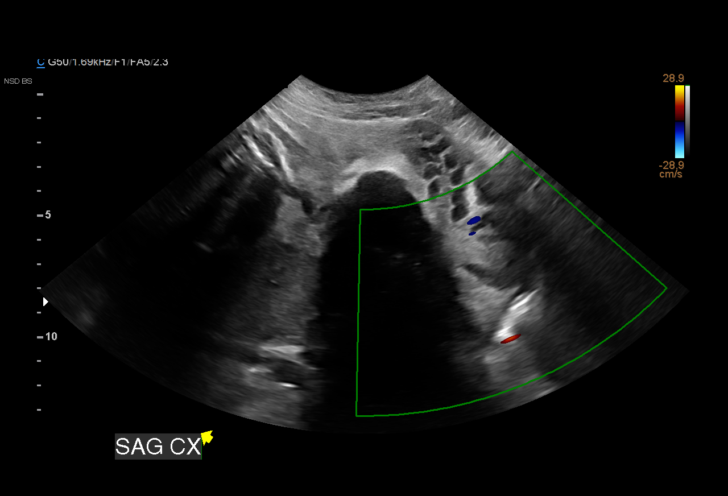
[im 11/41]
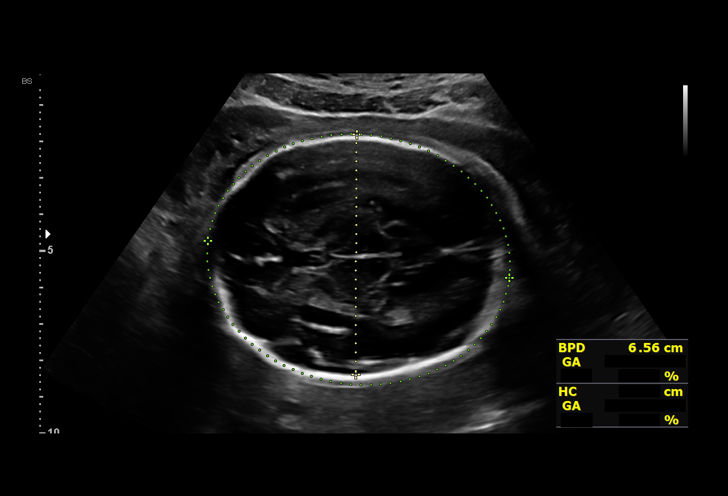
[im 14/41]
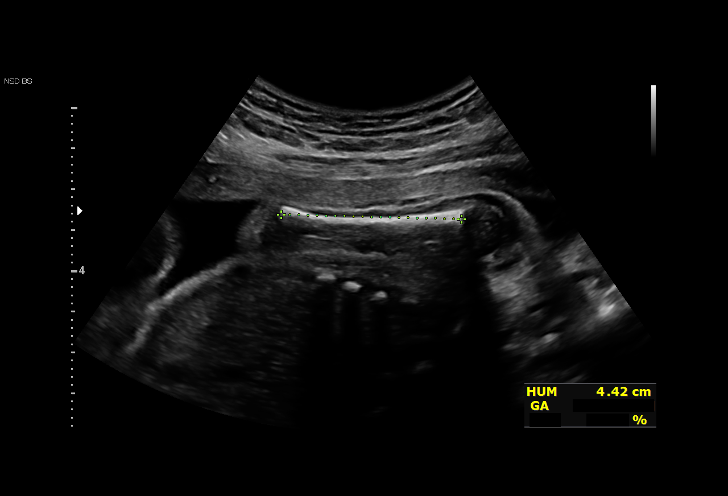
[im 17/41]
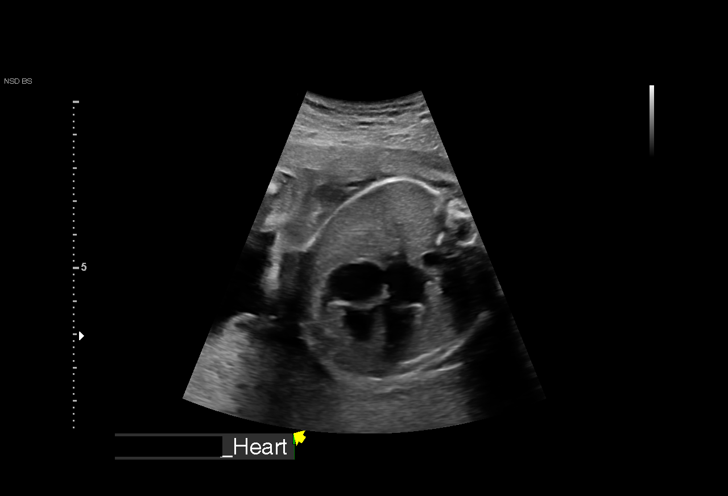
[im 20/41]
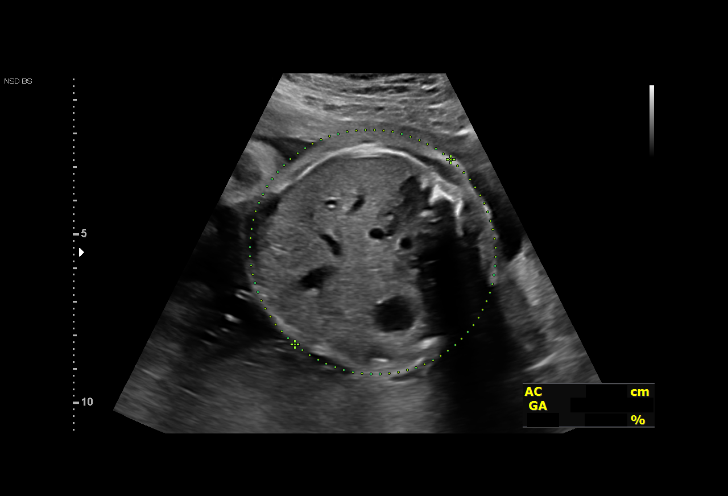
[im 23/41]
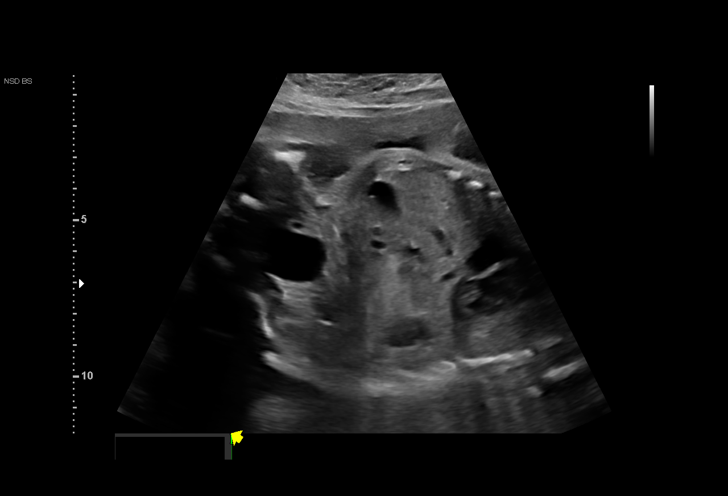
[im 26/41]
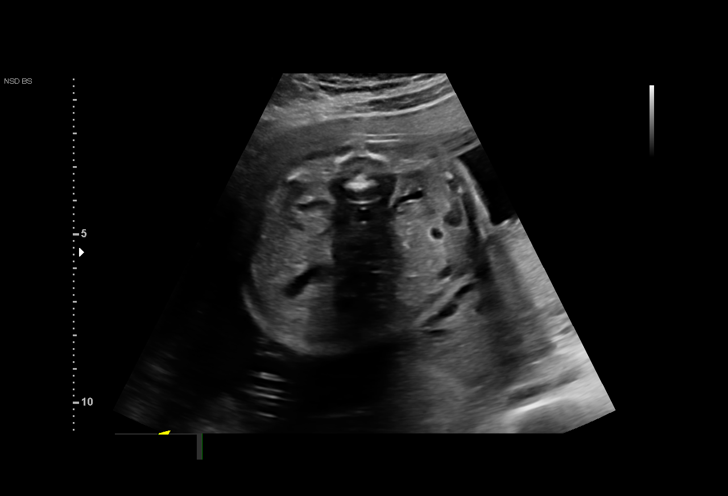
[im 29/41]
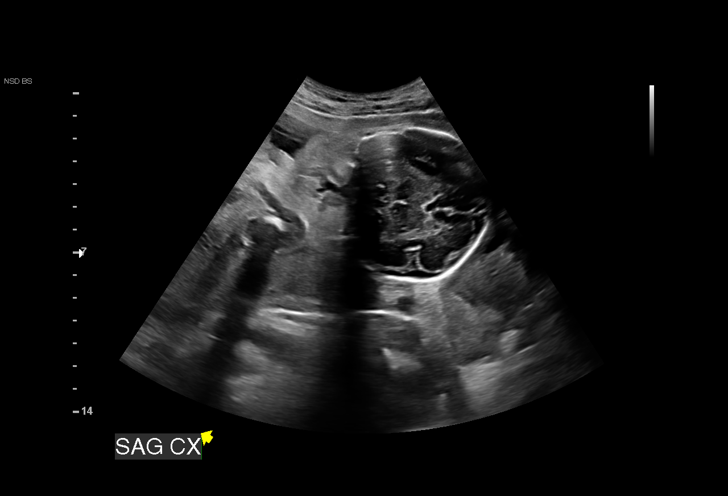
[im 32/41]
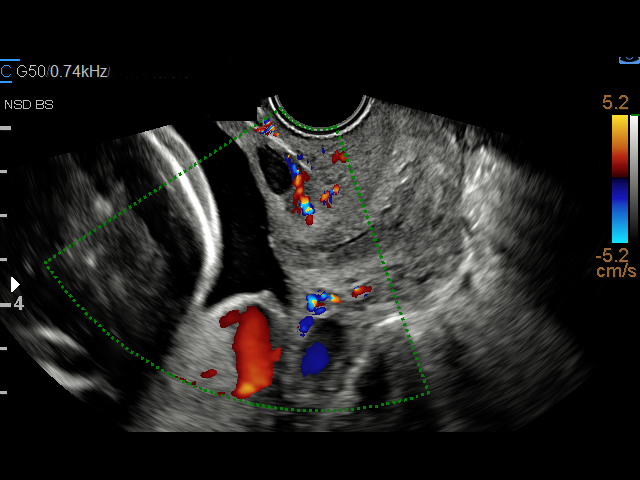
[im 35/41]
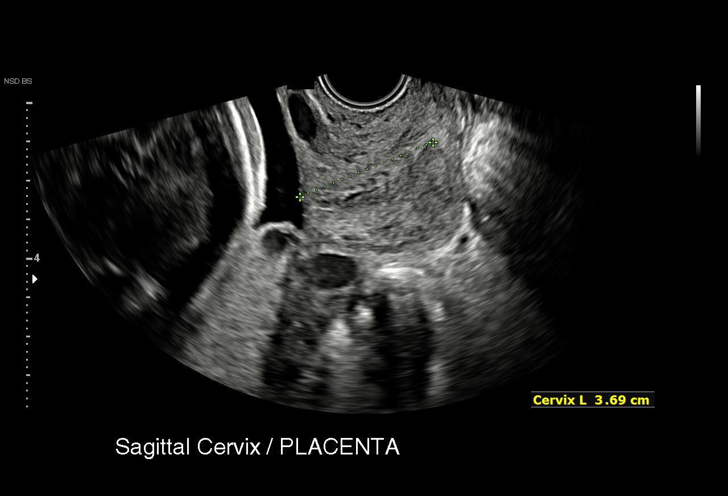
[im 38/41]
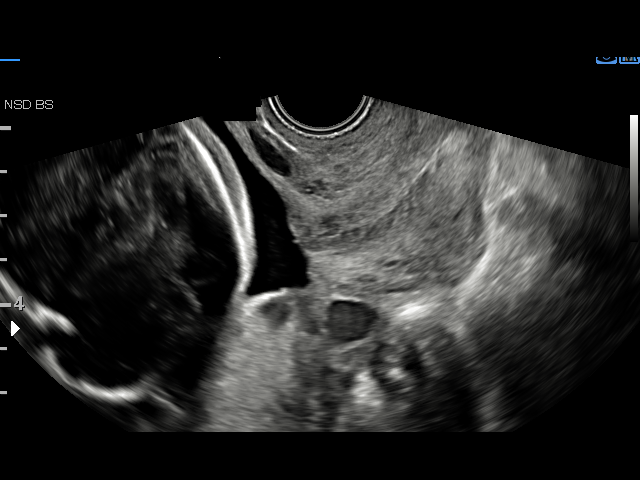
[im 41/41]
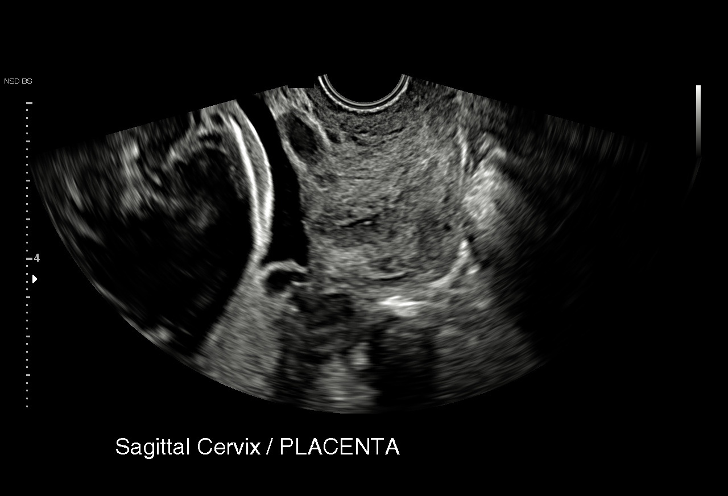

[14 of 28 positions shown; findings below may reference images not displayed]

[REDACTED]-
                   Faculty Physician

Indications

 Cystic Fibrosis (CF) Carrier, second trimester
 26 weeks gestation of pregnancy
 Encounter for antenatal screening for
 malformations
 Poor obstetrical history (Molar pregnancy
 2326)
 Placenta previa specified as without
 hemorrhage, second trimester
Fetal Evaluation

 Num Of Fetuses:         1
 Fetal Heart Rate(bpm):  132
 Cardiac Activity:       Observed
 Presentation:           Cephalic
 Placenta:               Posterior, low-lying, 1.2cm from int os
 P. Cord Insertion:      Previously seen as normal

 Amniotic Fluid
 AFI FV:      Within normal limits

                             Largest Pocket(cm)

Biometry

 BPD:      64.9  mm     G. Age:  26w 2d         23  %    CI:        73.94   %    70 - 86
                                                         FL/HC:      20.7   %    18.6 -
 HC:      239.7  mm     G. Age:  26w 0d          9  %    HC/AC:      1.04        1.05 -
 AC:      230.5  mm     G. Age:  27w 3d         64  %    FL/BPD:     76.3   %    71 - 87
 FL:       49.5  mm     G. Age:  26w 5d         34  %    FL/AC:      21.5   %    20 - 24
 HUM:      44.5  mm     G. Age:  26w 3d         40  %

 LV:        4.5  mm

 Est. FW:    0772  gm      2 lb 3 oz     48  %
Gestational Age

 LMP:           29w 1d        Date:  05/01/20                 EDD:   02/05/21
 U/S Today:     26w 4d                                        EDD:   02/23/21
 Best:          26w 5d     Det. By:  U/S  (09/26/20)          EDD:   02/22/21
Anatomy

 Cranium:               Appears normal         LVOT:                   Previously seen
 Cavum:                 Previously seen        Aortic Arch:            Previously seen
 Ventricles:            Appears normal         Ductal Arch:            Previously seen
 Choroid Plexus:        Previously seen        Diaphragm:              Appears normal
 Cerebellum:            Previously seen        Stomach:                Appears normal, left
                                                                       sided
 Posterior Fossa:       Previously seen        Abdomen:                Appears normal
 Nuchal Fold:           Previously seen        Abdominal Wall:         Previously seen
 Face:                  Orbits and profile     Cord Vessels:           Previously seen
                        previously seen
 Lips:                  Previously seen        Kidneys:                Appear normal
 Palate:                Previously seen        Bladder:                Appears normal
 Thoracic:              Appears normal         Spine:                  Previously seen
 Heart:                 Appears normal         Upper Extremities:      Previously seen
                        (4CH, axis, and
                        situs)
 RVOT:                  Previously seen        Lower Extremities:      Previously seen

 Other:  Male gender previously seen. Heels/feet and open hands/5th digits
         previously visualized. Nasal bone previously visualized.
Cervix Uterus Adnexa

 Cervix
 Length:           3.69  cm.
 Normal appearance by transvaginal scan

 Right Ovary
 Previously seen

 Left Ovary
 Previously seen.
Impression

 Follow up growth due to prior exam suggestive of placenta
 previa
 Normal interval growth with measurements consistent with
 dates
 Good fetal movement and amniotic fluid volume

 I discussed today's finding of a low lying placenta vs placenta
 previa. The placental edge is 1.2 cm from the internal os.
 Continue pelvic rest with bleeding precautions.
Recommendations

 Follow up growth and placental location in 6 weeks.
